# Patient Record
Sex: Female | Born: 2000 | Race: White | Hispanic: No | Marital: Married | Smoking: Never smoker
Health system: Southern US, Community
[De-identification: ages and names within clinical notes are randomized; demographics above are authoritative.]

## PROBLEM LIST (undated history)

## (undated) DIAGNOSIS — M79606 Pain in leg, unspecified: Secondary | ICD-10-CM

## (undated) DIAGNOSIS — J029 Acute pharyngitis, unspecified: Secondary | ICD-10-CM

## (undated) DIAGNOSIS — T7840XA Allergy, unspecified, initial encounter: Secondary | ICD-10-CM

## (undated) DIAGNOSIS — R002 Palpitations: Secondary | ICD-10-CM

## (undated) DIAGNOSIS — R0789 Other chest pain: Secondary | ICD-10-CM

## (undated) DIAGNOSIS — J988 Other specified respiratory disorders: Secondary | ICD-10-CM

## (undated) DIAGNOSIS — R000 Tachycardia, unspecified: Secondary | ICD-10-CM

## (undated) DIAGNOSIS — R509 Fever, unspecified: Secondary | ICD-10-CM

## (undated) DIAGNOSIS — R519 Headache, unspecified: Secondary | ICD-10-CM

## (undated) DIAGNOSIS — R569 Unspecified convulsions: Secondary | ICD-10-CM

## (undated) DIAGNOSIS — R5600 Simple febrile convulsions: Secondary | ICD-10-CM

## (undated) DIAGNOSIS — U071 COVID-19: Secondary | ICD-10-CM

## (undated) HISTORY — DX: Palpitations: R00.2

## (undated) HISTORY — DX: Simple febrile convulsions: R56.00

## (undated) HISTORY — DX: Headache, unspecified: R51.9

## (undated) HISTORY — DX: COVID-19: U07.1

## (undated) HISTORY — DX: Tachycardia, unspecified: R00.0

## (undated) HISTORY — DX: Other chest pain: R07.89

## (undated) HISTORY — DX: Pain in leg, unspecified: M79.606

## (undated) HISTORY — DX: Fever, unspecified: R50.9

## (undated) HISTORY — DX: Allergy, unspecified, initial encounter: T78.40XA

## (undated) HISTORY — DX: Acute pharyngitis, unspecified: J02.9

## (undated) HISTORY — DX: Other specified respiratory disorders: J98.8

## (undated) HISTORY — DX: Unspecified convulsions: R56.9

---
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
## 2019-12-30 ENCOUNTER — Emergency Department (HOSPITAL_COMMUNITY): Payer: BLUE CROSS/BLUE SHIELD

## 2019-12-30 ENCOUNTER — Other Ambulatory Visit: Payer: Self-pay

## 2019-12-30 ENCOUNTER — Emergency Department (HOSPITAL_COMMUNITY)
Admission: EM | Admit: 2019-12-30 | Discharge: 2019-12-31 | Disposition: A | Discharge status: Home / Self Care | Payer: BLUE CROSS/BLUE SHIELD | Attending: Emergency Medicine | Admitting: Emergency Medicine

## 2019-12-30 DIAGNOSIS — R509 Fever, unspecified: Secondary | ICD-10-CM | POA: Diagnosis present

## 2019-12-30 DIAGNOSIS — Z20822 Contact with and (suspected) exposure to covid-19: Secondary | ICD-10-CM | POA: Diagnosis not present

## 2019-12-30 DIAGNOSIS — R000 Tachycardia, unspecified: Secondary | ICD-10-CM | POA: Diagnosis not present

## 2019-12-30 DIAGNOSIS — R0789 Other chest pain: Secondary | ICD-10-CM | POA: Diagnosis not present

## 2019-12-30 DIAGNOSIS — R112 Nausea with vomiting, unspecified: Secondary | ICD-10-CM | POA: Insufficient documentation

## 2019-12-30 DIAGNOSIS — R519 Headache, unspecified: Secondary | ICD-10-CM | POA: Diagnosis not present

## 2019-12-30 DIAGNOSIS — J029 Acute pharyngitis, unspecified: Secondary | ICD-10-CM

## 2019-12-30 LAB — COMPREHENSIVE METABOLIC PANEL
ALT: 25 U/L (ref 0–44)
AST: 23 U/L (ref 15–41)
Albumin: 3.6 g/dL (ref 3.5–5.0)
Alkaline Phosphatase: 59 U/L (ref 38–126)
Anion gap: 13 (ref 5–15)
BUN: 5 mg/dL — ABNORMAL LOW (ref 6–20)
CO2: 22 mmol/L (ref 22–32)
Calcium: 8.9 mg/dL (ref 8.9–10.3)
Chloride: 97 mmol/L — ABNORMAL LOW (ref 98–111)
Creatinine, Ser: 0.84 mg/dL (ref 0.44–1.00)
GFR, Estimated: >60 mL/min (ref >60)
Glucose, Bld: 113 mg/dL — ABNORMAL HIGH (ref 70–99)
Potassium: 3.6 mmol/L (ref 3.5–5.1)
Sodium: 132 mmol/L — ABNORMAL LOW (ref 135–145)
Total Bilirubin: 0.6 mg/dL (ref 0.3–1.2)
Total Protein: 7.6 g/dL (ref 6.5–8.1)

## 2019-12-30 LAB — URINALYSIS, ROUTINE W REFLEX MICROSCOPIC
Bilirubin Urine: NEGATIVE
Glucose, UA: NEGATIVE mg/dL
Hgb urine dipstick: NEGATIVE
Ketones, ur: 20 mg/dL — AB
Nitrite: NEGATIVE
Protein, ur: NEGATIVE mg/dL
Specific Gravity, Urine: 1.01 (ref 1.005–1.030)
pH: 6 (ref 5.0–8.0)

## 2019-12-30 LAB — CBC WITH DIFFERENTIAL/PLATELET
Abs Immature Granulocytes: 0.05 10*3/uL (ref 0.00–0.07)
Basophils Absolute: 0 10*3/uL (ref 0.0–0.1)
Basophils Relative: 0 %
Eosinophils Absolute: 0 10*3/uL (ref 0.0–0.5)
Eosinophils Relative: 0 %
HCT: 36.3 % (ref 36.0–46.0)
Hemoglobin: 12.5 g/dL (ref 12.0–15.0)
Immature Granulocytes: 0 %
Lymphocytes Relative: 14 %
Lymphs Abs: 1.6 10*3/uL (ref 0.7–4.0)
MCH: 29.6 pg (ref 26.0–34.0)
MCHC: 34.4 g/dL (ref 30.0–36.0)
MCV: 86 fL (ref 80.0–100.0)
Monocytes Absolute: 0.9 10*3/uL (ref 0.1–1.0)
Monocytes Relative: 8 %
Neutro Abs: 9.1 10*3/uL — ABNORMAL HIGH (ref 1.7–7.7)
Neutrophils Relative %: 78 %
Platelets: 301 10*3/uL (ref 150–400)
RBC: 4.22 MIL/uL (ref 3.87–5.11)
RDW: 12.7 % (ref 11.5–15.5)
WBC: 11.7 10*3/uL — ABNORMAL HIGH (ref 4.0–10.5)
nRBC: 0 % (ref 0.0–0.2)

## 2019-12-30 LAB — I-STAT BETA HCG BLOOD, ED (MC, WL, AP ONLY): I-stat hCG, quantitative: 8 m[IU]/mL — ABNORMAL HIGH (ref <5)

## 2019-12-30 LAB — LACTIC ACID, PLASMA: Lactic Acid, Venous: 1.1 mmol/L (ref 0.5–1.9)

## 2019-12-30 MED ORDER — ACETAMINOPHEN 500 MG PO TABS
1000.0000 mg | ORAL_TABLET | Freq: Once | ORAL | Status: AC
  Administered 2019-12-30: 1000 mg via ORAL
  Filled 2019-12-30: qty 2

## 2019-12-30 NOTE — ED Triage Notes (Signed)
Pt has been sick intermittently since mid-October. Has been well through some of November but this past Tuesday woke up with high fever, has n/v, profoundly swollen lymph nodes. Just went to her PCP and had negative strep test but they told her that there throat looked terrible. Seen at multiple hospitals and doctor's offices in [place] for same with negative workups and negative tests for strep, mono, flu, RSV, covid, etc.

## 2019-12-31 ENCOUNTER — Emergency Department (HOSPITAL_COMMUNITY): Payer: BLUE CROSS/BLUE SHIELD

## 2019-12-31 LAB — HCG, SERUM, QUALITATIVE: Preg, Serum: NEGATIVE

## 2019-12-31 LAB — HIV ANTIBODY (ROUTINE TESTING W REFLEX): HIV Screen 4th Generation wRfx: NONREACTIVE

## 2019-12-31 LAB — RESP PANEL BY RT-PCR (FLU A&B, COVID) ARPGX2
Influenza A by PCR: NEGATIVE
Influenza B by PCR: NEGATIVE
SARS Coronavirus 2 by RT PCR: NEGATIVE

## 2019-12-31 LAB — D-DIMER, QUANTITATIVE: D-Dimer, Quant: 1.56 ug/mL-FEU — ABNORMAL HIGH (ref 0.00–0.50)

## 2019-12-31 LAB — TROPONIN I (HIGH SENSITIVITY): Troponin I (High Sensitivity): 4 ng/L (ref <18)

## 2019-12-31 LAB — RAPID HIV SCREEN (HIV 1/2 AB+AG)
HIV 1/2 Antibodies: NONREACTIVE
HIV-1 P24 Antigen - HIV24: NONREACTIVE

## 2019-12-31 LAB — GROUP A STREP BY PCR: Group A Strep by PCR: NOT DETECTED

## 2019-12-31 LAB — LACTIC ACID, PLASMA: Lactic Acid, Venous: 0.9 mmol/L (ref 0.5–1.9)

## 2019-12-31 LAB — MONONUCLEOSIS SCREEN: Mono Screen: NEGATIVE

## 2019-12-31 MED ORDER — DEXTROSE 5 % IV SOLN
500.0000 mg | Freq: Once | INTRAVENOUS | Status: AC
  Administered 2019-12-31: 500 mg via INTRAVENOUS
  Filled 2019-12-31: qty 500

## 2019-12-31 MED ORDER — DEXAMETHASONE 6 MG PO TABS: 6.0000 mg | ORAL_TABLET | Freq: Once | ORAL | 0 refills | Status: AC

## 2019-12-31 MED ORDER — IOHEXOL 350 MG/ML SOLN
80.0000 mL | Freq: Once | INTRAVENOUS | Status: AC | PRN
  Administered 2019-12-31: 80 mL via INTRAVENOUS

## 2019-12-31 MED ORDER — DEXAMETHASONE 6 MG PO TABS: 6.0000 mg | ORAL_TABLET | Freq: Once | ORAL | 0 refills | Status: DC

## 2019-12-31 MED ORDER — DOXYCYCLINE HYCLATE 100 MG PO CAPS: 100.0000 mg | ORAL_CAPSULE | Freq: Two times a day (BID) | ORAL | 0 refills | Status: DC

## 2019-12-31 MED ORDER — DOXYCYCLINE HYCLATE 100 MG PO CAPS: 100.0000 mg | ORAL_CAPSULE | Freq: Two times a day (BID) | ORAL | 0 refills | Status: AC

## 2019-12-31 NOTE — ED Provider Notes (Signed)
[facility] EMERGENCY DEPARTMENT Provider Note   CSN: [id number] Arrival date & time: 12/30/19  1545     History No chief complaint on file.   Alexis Norton is a 19 y.o. female.  Presents to ER with concern for fever.  Patient reports having similar illness in October.  This current illness started on Sunday.  Noted malaise, nausea and occasional episodes of vomiting but no abdominal pain.  Then on Tuesday she noted a fever and has had a fever daily since.  Yesterday started having a severe sore throat, worse with swallowing.  No neck pain or neck stiffness.  Has mild headache.  This only started a couple days ago.  Yesterday had an episode of palpitations, chest tightness, occurring at rest not associated with exertion, no alleviating or aggravating factors.  Resolved spontaneously while in waiting room of the ER.  No cough.  Primary doctor noted exudates on Friday and provided Rx for clindamycin.  She is sexually active, one female partner, has had oral sex, no known STD exposures.  Denies prior history of STDs. No new vaginal discharge, no pelvic pain or cramping.  She reports in late October she had fevers for many days, general feeling of unwell, went to urgent care and ER in [place].  Reports that they did lots of testing including mono, Covid, influenza which were negative.  She reports her WBCs were elevated at 24,000.  States that she was provided steroids and antibiotics and her symptoms resolved soon thereafter.  HPI     No past medical history on file.  There are no problems to display for this patient.  OB History   No obstetric history on file.     No family history on file.  Social History   Tobacco Use  . Smoking status: Not on file  Substance Use Topics  . Alcohol use: Not on file  . Drug use: Not on file    Home Medications Prior to Admission medications   Medication Sig Start Date End Date Taking? Authorizing Provider  ascorbic  acid (VITAMIN C) 500 MG tablet Take 500 mg by mouth daily.   Yes [provider]  ELDERBERRY PO Take 1 tablet by mouth daily.   Yes [provider]  loratadine (CLARITIN) 10 MG tablet Take 10 mg by mouth at bedtime.   Yes [provider]  Magnesium 250 MG TABS Take 2 tablets by mouth daily.   Yes [provider]  norgestrel-ethinyl estradiol (LO/OVRAL) 0.3-30 MG-MCG tablet Take 1 tablet by mouth daily.   Yes [provider]  Probiotic Product (PROBIOTIC PO) Take 1 capsule by mouth daily.   Yes [provider]  Vitamin D, Cholecalciferol, 25 MCG (1000 UT) TABS Take 1 tablet by mouth daily.   Yes [provider]    Allergies    Cephalosporins, Penicillin g, Sulfa antibiotics, and Sulfamethoxazole-trimethoprim  Review of Systems   Review of Systems  Constitutional: Positive for chills and fever.  HENT: Positive for sore throat. Negative for ear pain.   Eyes: Negative for pain and visual disturbance.  Respiratory: Positive for chest tightness and shortness of breath. Negative for cough.   Cardiovascular: Negative for chest pain and palpitations.  Gastrointestinal: Negative for abdominal pain and vomiting.  Genitourinary: Negative for dysuria and hematuria.  Musculoskeletal: Negative for arthralgias and back pain.  Skin: Negative for color change and rash.  Neurological: Negative for seizures and syncope.  All other systems reviewed and are negative.  Physical Exam Updated Vital Signs BP (!) 139/96   Pulse (!) 105   Temp 98.6 F (37 C) (Oral)   Resp 16   Ht 5' 8"  (1.727 m)   Wt 67.1 kg   LMP 12/16/2019 (Approximate)   SpO2 100%   BMI 22.50 kg/m   Physical Exam Vitals and nursing note reviewed.  Constitutional:      General: She is not in acute distress.    Appearance: She is well-developed.  HENT:     Head: Normocephalic and atraumatic.     Mouth/Throat:     Comments: Bilateral tonsillar exudates, no apparent  peritonsillar abscess Eyes:     Conjunctiva/sclera: Conjunctivae normal.  Neck:     Comments: Normal neck range of motion, noted cervical lymphadenopathy bilaterally Cardiovascular:     Rate and Rhythm: Regular rhythm. Tachycardia present.     Heart sounds: No murmur heard.   Pulmonary:     Effort: Pulmonary effort is normal. No respiratory distress.     Breath sounds: Normal breath sounds.  Abdominal:     Palpations: Abdomen is soft.     Tenderness: There is no abdominal tenderness.  Musculoskeletal:     Cervical back: Neck supple.  Skin:    General: Skin is warm and dry.  Neurological:     Mental Status: She is alert.     ED Results / Procedures / Treatments   Labs (all labs ordered are listed, but only abnormal results are displayed) Labs Reviewed  COMPREHENSIVE METABOLIC PANEL - Abnormal; Notable for the following components:      Result Value   Sodium 132 (*)    Chloride 97 (*)    Glucose, Bld 113 (*)    BUN 5 (*)    All other components within normal limits  CBC WITH DIFFERENTIAL/PLATELET - Abnormal; Notable for the following components:   WBC 11.7 (*)    Neutro Abs 9.1 (*)    All other components within normal limits  URINALYSIS, ROUTINE W REFLEX MICROSCOPIC - Abnormal; Notable for the following components:   APPearance HAZY (*)    Ketones, ur 20 (*)    Leukocytes,Ua LARGE (*)    Bacteria, UA FEW (*)    All other components within normal limits  I-STAT BETA HCG BLOOD, ED ([facility], [facility], [facility] ONLY) - Abnormal; Notable for the following components:   I-stat hCG, quantitative 8.0 (*)    All other components within normal limits  RESP PANEL BY RT-PCR (FLU A&B, COVID) ARPGX2  GROUP A STREP BY PCR  AEROBIC CULTURE (SUPERFICIAL SPECIMEN)  LACTIC ACID, PLASMA  LACTIC ACID, PLASMA  MONONUCLEOSIS SCREEN  HCG, SERUM, QUALITATIVE  D-DIMER, QUANTITATIVE (NOT AT [facility])  HIV ANTIBODY (ROUTINE TESTING W REFLEX)  RAPID HIV SCREEN (HIV 1/2 AB+AG)  RPR  GC/CHLAMYDIA PROBE AMP  ([place]) NOT AT [facility]  GC/CHLAMYDIA PROBE AMP ([place]) NOT AT [facility]  TROPONIN I (HIGH SENSITIVITY)    EKG None  Radiology DG Chest 2 View  Result Date: 12/30/2019 CLINICAL DATA:  Fever, chest tightness since Tuesday EXAM: CHEST - 2 VIEW COMPARISON:  None. FINDINGS: Few clustered punctate micro nodules are seen in the right lung. No focal consolidative opacity is seen. No pneumothorax or effusion. The cardiomediastinal contours are unremarkable. No acute osseous or soft tissue abnormality. IMPRESSION: Indeterminate clustered punctate micronodules in the right lung, could reflect atypical infection or sequela prior granulomatous disease. No other acute cardiopulmonary abnormality. Electronically Signed   By: Lovena Le M.D.   On:  12/30/2019 17:03    Procedures Procedures (including critical care time)  Medications Ordered in ED Medications  cefTRIAXone (ROCEPHIN) 500 mg in dextrose 5 % 50 mL IVPB (has no administration in time range)  acetaminophen (TYLENOL) tablet 1,000 mg (1,000 mg Oral Given 12/30/19 1617)    ED Course  I have reviewed the triage vital signs and the nursing notes.  Pertinent labs & imaging results that were available during my care of the patient were reviewed by me and considered in my medical decision making (see chart for details).    MDM Rules/Calculators/A&P                         19 year old girl presents to ER with myriad symptoms.  Current illness since Sunday, fever since Tuesday.  On exam noted significant tonsillar exudates but no apparent abscess.  Normal neck range of motion and patient is overall well-appearing.  Reports penicillin allergy, already prescribed clindamycin by primary doctor for suspected pharyngitis.  Strep negative. Given reported sexual history, will check for GC chlamydia, syphilis, HIV.  Will treat empirically for gonorrhea and chlamydia as gonococcal pharyngitis is on differential.  Given she reported similar febrile illness  a month ago, provided information for outpatient infectious disease clinic.  Initially febrile and tachycardic after period of observation, antipyretic, patient had normal heart rate and temperature.  Tolerating p.o. without any difficulty.  Will discharge home.  Additionally recommended close recheck with primary doctor, patient reports already having appointment early this coming week.  After the discussed management above, the patient was determined to be safe for discharge.  The patient was in agreement with this plan and all questions regarding their care were answered.  ED return precautions were discussed and the patient will return to the ED with any significant worsening of condition.   Final Clinical Impression(s) / ED Diagnoses Final diagnoses:  Acute pharyngitis, unspecified etiology  Fever in adult    Rx / DC Orders ED Discharge Orders    None       Lucrezia Starch, MD 12/31/19 1101

## 2019-12-31 NOTE — Discharge Instructions (Signed)
Take antibiotics as prescribed. Continue your clindamycin and start the doxycycline. Follow-up with both your primary doctor as well as infectious disease. If you have worsening fevers, worsening throat pain, inability to swallow, neck stiffness, or other new concerning symptom, return to ER for reassessment.

## 2020-01-01 LAB — RPR: RPR Ser Ql: NONREACTIVE

## 2020-01-02 LAB — GC/CHLAMYDIA PROBE AMP (~~LOC~~) NOT AT ARMC
Chlamydia: NEGATIVE
Chlamydia: NEGATIVE
Comment: NEGATIVE
Comment: NEGATIVE
Comment: NORMAL
Comment: NORMAL
Neisseria Gonorrhea: NEGATIVE
Neisseria Gonorrhea: NEGATIVE

## 2020-01-02 LAB — AEROBIC CULTURE W GRAM STAIN (SUPERFICIAL SPECIMEN): Culture: NORMAL

## 2020-01-03 ENCOUNTER — Telehealth: Payer: Self-pay | Admitting: *Deleted

## 2020-01-03 NOTE — Telephone Encounter (Signed)
Post ED Visit - Positive Culture Follow-up  Culture report reviewed by antimicrobial stewardship pharmacist: [facility] []  , Pharm.D. []  Enzo Bi, Pharm.D., BCPS AQ-ID []  , Pharm.D., BCPS [x]  Celedonio Miyamoto, Pharm.D., BCPS []  [place], Garvin Fila.D., BCPS, AAHIVP []  , Pharm.D., BCPS, AAHIVP []  Georgina Pillion, PharmD, BCPS []  , PharmD, BCPS []  [place], PharmD, BCPS []  [address], PharmD []  , PharmD, BCPS []  Estella Husk, PharmD  Pharmacy Team []  Lysle Pearl, PharmD []  , PharmD []  Phillips Climes, PharmD []  , Rph []  Agapito Games) , PharmD []  Verlan Friends, PharmD []  , PharmD []  Mervyn Gay, PharmD []  , PharmD []  Vinnie Level, PharmD []  [facility], PharmD []  , PharmD []  Len Childs, PharmD   Positive wound culture Treated with Doxycycline Hyclate, organism sensitive to the same and no further patient follow-up is required at this time.  [facility] 01/03/2020, 11:27 AM

## 2020-02-03 ENCOUNTER — Other Ambulatory Visit: Payer: Self-pay

## 2020-02-03 ENCOUNTER — Ambulatory Visit: Payer: BC Managed Care – PPO | Admitting: Infectious Diseases

## 2020-02-03 VITALS — BP 131/84 | HR 91 | Temp 97.9°F | Wt 156.8 lb

## 2020-02-03 DIAGNOSIS — Z114 Encounter for screening for human immunodeficiency virus [HIV]: Secondary | ICD-10-CM | POA: Diagnosis not present

## 2020-02-03 DIAGNOSIS — R000 Tachycardia, unspecified: Secondary | ICD-10-CM

## 2020-02-03 DIAGNOSIS — R509 Fever, unspecified: Secondary | ICD-10-CM | POA: Diagnosis not present

## 2020-02-03 NOTE — Progress Notes (Signed)
[facility]                                                             [address], [place], [place], [address]                                                                  Phn. [phone]; Fax: [phone]                                                                             Date: 02/03/2020  Reason for Referral: Fever of Unknown Origin   Assessment 20 Y O Healthy Female with no PMH other than mild COVID in January 2021 who is here for evaluation of recurrent episodes of fever. She has had 3 ,episodes of fever since mid October with a doctor visit and 3 ED visits with relatively unremarkable work up. Her most recent ED visit at [facility] was found to have severe pharyngitis with pharyngeal exudates for which she was given IV ceftriaxone  Work up so far CBC with mild leukocytosis 11. Otherwise CBC and CMP unremarkable. Group A strep PCR of throat negative. Throat Cx with normal OPF UA large leukocytes , few bacteria, WBC 11-20. Denies any GU symptoms Respiratory panel negative, Influenza A and B negative, Mono negative, SARSCoV2 negative HIV screen 4th generation negative RPR NR Oral and vaginal GC negative CT chest unremarkable She is not immunocompromised and has does not have any significant exposure to atypica/fungal infections  Her last febrile episode was more than a month ago and she feels almost 95% back to her baseline. The only thing that is bothering her is her Heart rate that goes up without any triggers. No murmur on exam. I think she will benefit from evaluation from Cardiology.  Her clinical presentation looks more of a viral process to me compared to bacterial/atypical infections   Plan No acute indication of any antibiotics currently Referral to Cardiology Will do the following for completeness  Orders Placed This Encounter  Procedures   Blood culture (routine  single)   Blood culture (routine single)   QuantiFERON-TB Gold Plus   Epstein-Barr virus nuclear antigen antibody, IgG   Epstein-Barr virus VCA antibody panel   CMV IgM   C-reactive protein   Sedimentation rate   CK (Creatine Kinase)   Lactate Dehydrogenase (LDH)   Hepatitis C antibody   Rheumatoid Factor   Antinuclear Antib (ANA)   HIV 1 RNA quant-no reflex-bld   TSH   Cytomegalovirus antibody, IgG   Cytomegalovirus antibody, IgG   Ambulatory referral to Cardiology   Gave instructions to her regarding immediate need for evaluation in case of worsening symptoms  Fu in 3 weeks   All questions and concerns were discussed and  addressed. Patient verbalized understanding of the plan. ____________________________________________________________________________________________________________________  HPI: 61 Y O female who is here for evaluation of fever with her mother. History is obtained from chart review and from patient.  She says at the end of September 2021, she got covid vaccine, 2nd dose of P fizer after which she had fever, body ache that went away in a day or two. Around third week of October, she noticed that her body is extremely hot. Her temperature was 100.7 F. Shecalled out of work. This was associated with headache. Fever was as high as  102.5 -103 which would not break with tylenol/ibuprofen. She went to a doctor in [place] where she was tested for Respiratory viruses, RSV, Mono etc all of which per patient was negative. She says she was sent home on a course of erythromicin and steroids which she took as instructed. She felt better for 2-3 days.  She again woke up few days later not  feeling well and started having fevers. She also felt like she almost passed out. She also had huge lymph nodes in the neck. Her mother says the lymph nodes were so much swollen that it extended up to her eyes. She went to Urgent care where she had a tempr of 103F, HR 165,  labs WBC 24.3K. She was sent to ED given her leukocytosis- given fluids which didn't help at all. She continued to be febrile and tachycardic. She was presumed to have left arm cellulitis and was given IV abx and was discharged clindamycin 7 days which she took as instructed. She does not think however she had cellulitis as she mostly lies in her left side and thinks that is why her left arm was red at that time  She felt better a day or two after which she  almost passed out at night and was rushed to  ED in [place] where her  WBC 11K within a day. The doctor told her that she is less likely to have a bacterial infection given sudden drop in WBC from 23K to 11K in a short span of time  and was told to follow up with her family doctor.  One month later, around the end of November, she again started having fever and lymph node swelling in her neck. She had poor appetite and nausea, NBNB vomiting and few episodes of loose stool. He was extremely fatigued. She presented to the ED on 12/4 at [facility], she was found to have severe pharyngitis with bilateral tonsillar exudates. She as given IV ceftriaoxne and sent home.  She also had h/o COVID in January 2021. She had low grade fever, loss of appetite and loss of tastes at that time. She did not need to be admitted in the hospital and did not received any COVID specific tx.   She says besides the febrile episodes, another issue bothering is her HR ranging in 130s-140s. She says she had several episodes where her HR was found to be as high as 160s even when she is resting and not exerting. She wears a smart watch which notifies her whenever her HR is high. She also remembers few instances where she was in the bed and her HR was in 130s. During  Christmas when she was arranging gifts, all of sudden her HR went up. Her HR has been also checked by a coworker with a pulse ox and it was high to make sure the smart watch is working properly. She works in radiology at  [employer]  She also has bilateral legs pain shooting down. Also had nausea/vomiting and diarrhea with febrile episodes. Has lost 10 lbs during these febrile episodes She also had a rash in her face which has resolved now.   Mother says she has been tested: 10 covid test, 5 flu swabs, 4 strep tests, 3 mono tests, tick panel - all were negative Denies cough and SOB but complains of chest tightness. Denies any outdooor activities or tick exposure  She also has chills and shaking when fever resolves associated with intense sweating   PMH- no  PSH- wisdom teeth removed Meds- OCP, claritin Allergy - Penicillin,  Social - no smoking, alcohol and drugs Allergy: Bactrim , cephalosporins,  Social: Sexually active with oyfriend. Last sexual activity  was Wednesday . He is not sick. No sick contacts. No GU symptoms. LMP today. Denies any vaginal discharge/itching and burning   Born in [place], [place]. [place] No recent travel except to [place]  In [place] in Summer 2021. Has traveled to [facility] [place], [place] at least 4-5 years ago and traveled to  [place] April last year  No travel out of country except [place] 4 years ago  Has a pet dog and outside. Both are not sick  Appetite is ok now, weight is coming back to normal   Headache with fevers, denies blurry vision, denies ear pain and sinus pain  Last febrile episode in Dec 4 She feels 95% back to basline by now  ROS: 11 point ROS done with pertinent positives and negatives listed above, Rest of ROS is negative    Current Outpatient Medications on File Prior to Visit  Medication Sig Dispense Refill   ascorbic acid (VITAMIN C) 500 MG tablet Take 500 mg by mouth daily.     ELDERBERRY PO Take 1 tablet by mouth daily.     loratadine (CLARITIN) 10 MG tablet Take 10 mg by mouth at bedtime.     Magnesium 250 MG TABS Take 2 tablets by mouth daily.     norgestrel-ethinyl estradiol (LO/OVRAL) 0.3-30 MG-MCG tablet Take 1 tablet by mouth daily.      Probiotic Product (PROBIOTIC PO) Take 1 capsule by mouth daily.     Vitamin D, Cholecalciferol, 25 MCG (1000 UT) TABS Take 1 tablet by mouth daily.     ZINC CITRATE PO Take by mouth.     No current facility-administered medications on file prior to visit.   Allergies  Allergen Reactions   Cephalosporins Hives and Itching   Penicillin G Hives   Sulfa Antibiotics Hives and Itching   Sulfamethoxazole-Trimethoprim Hives and Itching   Social History   Socioeconomic History   Marital status: Single    Spouse name: Not on file   Number of children: Not on file   Years of education: Not on file   Highest education level: Not on file  Occupational History   Not on file  Tobacco Use   Smoking status: Not on file   Smokeless tobacco: Not on file  Substance and Sexual Activity   Alcohol use: Not on file   Drug use: Not on file   Sexual activity: Not on file  Other Topics Concern   Not on file  Social History Narrative   Not on file   Social Determinants of Health   Financial Resource Strain: Not on file  Food Insecurity: Not on file  Transportation Needs: Not on file  Physical Activity: Not on file  Stress: Not on file  Social Connections: Not on  file  Intimate Partner Violence: Not on file     Vitals BP 131/84    Pulse 91    Temp 97.9 F (36.6 C) (Oral)    Wt 156 lb 12.8 oz (71.1 kg)    BMI 23.84 kg/m    Examination  General - not in acute distress, comfortably sitting in chair HEENT - PEERLA, no pallor and no icterus Chest - b/l clear air entry, no additional sounds CVS- Normal s1s2, RRR Abdomen - Soft, Non tender , non distended Ext- no pedal edema Neuro: grossly normal Back - WNL Lymph nodes - no cervical and axillary lymphadenopathy  Skin - no rashes  Psych : calm and cooperative   Recent labs CBC Latest Ref Rng & Units 12/30/2019  WBC 4.0 - 10.5 K/uL 11.7(H)  Hemoglobin 12.0 - 15.0 g/dL 12.5  Hematocrit 36.0 - 46.0 % 36.3  Platelets  150 - 400 K/uL 301   CMP Latest Ref Rng & Units 12/30/2019  Glucose 70 - 99 mg/dL 113(H)  BUN 6 - 20 mg/dL 5(L)  Creatinine 0.44 - 1.00 mg/dL 0.84  Sodium 135 - 145 mmol/L 132(L)  Potassium 3.5 - 5.1 mmol/L 3.6  Chloride 98 - 111 mmol/L 97(L)  CO2 22 - 32 mmol/L 22  Calcium 8.9 - 10.3 mg/dL 8.9  Total Protein 6.5 - 8.1 g/dL 7.6  Total Bilirubin 0.3 - 1.2 mg/dL 0.6  Alkaline Phos 38 - 126 U/L 59  AST 15 - 41 U/L 23  ALT 0 - 44 U/L 25     Pertinent Microbiology Results for orders placed or performed during the hospital encounter of 12/30/19  Resp Panel by RT-PCR (Flu A&B, Covid) Nasopharyngeal Swab     Status: None   Collection Time: 12/31/19  8:55 AM   Specimen: Nasopharyngeal Swab; Nasopharyngeal(NP) swabs in vial transport medium  Result Value Ref Range Status   SARS Coronavirus 2 by RT PCR NEGATIVE NEGATIVE Final    Comment: (NOTE) SARS-CoV-2 target nucleic acids are NOT DETECTED.  The SARS-CoV-2 RNA is generally detectable in upper respiratory specimens during the acute phase of infection. The lowest concentration of SARS-CoV-2 viral copies this assay can detect is 138 copies/mL. A negative result does not preclude SARS-Cov-2 infection and should not be used as the sole basis for treatment or other patient management decisions. A negative result may occur with  improper specimen collection/handling, submission of specimen other than nasopharyngeal swab, presence of viral mutation(s) within the areas targeted by this assay, and inadequate number of viral copies(<138 copies/mL). A negative result must be combined with clinical observations, patient history, and epidemiological information. The expected result is Negative.  Fact Sheet for Patients:  EntrepreneurPulse.com.au  Fact Sheet for Healthcare Providers:  IncredibleEmployment.be  This test is no t yet approved or cleared by the [place] FDA and  has been authorized for  detection and/or diagnosis of SARS-CoV-2 by FDA under an Emergency Use Authorization (EUA). This EUA will remain  in effect (meaning this test can be used) for the duration of the COVID-19 declaration under Section 564(b)(1) of the Act, 21 U.S.C.section 360bbb-3(b)(1), unless the authorization is terminated  or revoked sooner.       Influenza A by PCR NEGATIVE NEGATIVE Final   Influenza B by PCR NEGATIVE NEGATIVE Final    Comment: (NOTE) The Xpert Xpress SARS-CoV-2/FLU/RSV plus assay is intended as an aid in the diagnosis of influenza from Nasopharyngeal swab specimens and should not be used as a sole basis for treatment. Nasal washings  and aspirates are unacceptable for Xpert Xpress SARS-CoV-2/FLU/RSV testing.  Fact Sheet for Patients: BloggerCourse.com  Fact Sheet for Healthcare Providers: SeriousBroker.it  This test is not yet approved or cleared by the [place] FDA and has been authorized for detection and/or diagnosis of SARS-CoV-2 by FDA under an Emergency Use Authorization (EUA). This EUA will remain in effect (meaning this test can be used) for the duration of the COVID-19 declaration under Section 564(b)(1) of the Act, 21 U.S.C. section 360bbb-3(b)(1), unless the authorization is terminated or revoked.  Performed at [facility], [address], [place], [place] [address]   Group A Strep by PCR     Status: None   Collection Time: 12/31/19  8:55 AM   Specimen: Nasopharyngeal Swab; Sterile Swab  Result Value Ref Range Status   Group A Strep by PCR NOT DETECTED NOT DETECTED Final    Comment: Performed at [facility], [address], [place], [place] [address]  Wound or Superficial Culture     Status: None   Collection Time: 12/31/19  9:33 AM   Specimen: Wound  Result Value Ref Range Status   Specimen Description WOUND  Final   Special Requests THROAT  Final   Gram Stain   Final    RARE WBC  PRESENT, PREDOMINANTLY PMN ABUNDANT GRAM NEGATIVE RODS RARE GRAM POSITIVE COCCI    Culture   Final    FEW NORMAL OROPHARYNGEAL FLORA NO GROUP A STREP (S.PYOGENES) ISOLATED NO STAPHYLOCOCCUS AUREUS ISOLATED Performed at [facility], [address], [place], [place] [address]    Report Status 01/02/2020 FINAL  Final   Pertinent Imagings CT chest 12/31/19 FINDINGS: Cardiovascular: Thoracic aorta is well visualized. No significant aneurysmal dilatation of the thoracic aorta is noted. No cardiac enlargement is seen. No significant coronary calcifications are noted. The pulmonary artery shows a normal branching pattern. No filling defect to suggest pulmonary embolism is seen.  Mediastinum/Nodes: Thoracic inlet is within normal limits. No sizable hilar or mediastinal adenopathy is noted. Residual thymus tissue is noted anteriorly. The esophagus as visualized is within normal limits.  Lungs/Pleura: The lungs are well aerated bilaterally. No focal infiltrate or sizable effusion is seen.  Upper Abdomen: Visualized upper abdomen is unremarkable.  Musculoskeletal: No acute bony abnormality noted.  Review of the MIP images confirms the above findings.  IMPRESSION: No evidence of pulmonary emboli.  No other focal abnormality is noted.  All pertinent labs/Imagings/notes reviewed. All pertinent plain films and CT images have been personally visualized and interpreted; radiology reports have been reviewed. Decision making incorporated into the Impression / Recommendations.  I spent greater than 25 minutes with the patient including  review of prior medical records with greater than 50% of time in face to face counsel of the patient.    Electronically signed by:  Odette Fraction, MD Infectious Disease Physician [facility] [address] [place], [place] [address] Phone: [phone]   Fax: [phone]

## 2020-02-04 DIAGNOSIS — Z114 Encounter for screening for human immunodeficiency virus [HIV]: Secondary | ICD-10-CM | POA: Insufficient documentation

## 2020-02-04 DIAGNOSIS — R509 Fever, unspecified: Secondary | ICD-10-CM | POA: Insufficient documentation

## 2020-02-04 DIAGNOSIS — R000 Tachycardia, unspecified: Secondary | ICD-10-CM | POA: Insufficient documentation

## 2020-02-04 NOTE — Assessment & Plan Note (Signed)
I will check for HIV RNA for possible acute retroviral syndrome

## 2020-02-04 NOTE — Assessment & Plan Note (Signed)
Referral to Cardiology

## 2020-02-04 NOTE — Assessment & Plan Note (Signed)
Likely a viral process Will do further labs today No indication of antibiotics currently Fu in 3 weeks

## 2020-02-06 NOTE — Progress Notes (Signed)
Cardiology Office Note:    Date:  02/07/2020   ID:  Alexis Norton, DOB Aug 28, 2000, MRN [id number]  PCP:  Patient, No Pcp Per  [facility] Cardiologist:  No primary care provider on file.  [facility] Electrophysiologist:  None   Referring MD: Odette Fraction, MD    History of Present Illness:    Alexis Norton is a 20 y.o. female with a history of COVID in 01/2019 who was referred by Dr. Elinor Parkinson for further evaluation of sinus tachycardia.  Patient was seen in ID clinic for recurrent fevers. Large infectious and rheumatologic work-up performed with no clear source. Thought possibly due to viral illness. During that visit, the patient stated that she had episodes of tachycardia without clear triggers for which she was referred to cardiology for further management. ECG with NSR, early r-wave progression.   Today, the patient states that she got the second dose of her COVID vaccine in September. Had fever, body aches and HA x2 days afterwards. 3 weeks later she had high fever, lymphadenopathy, and  myalgias. Took ABX and steroids with some improvement but as soon as she stopped, she again developed fevers, lymphadenopathy and myalgias. She went to the hospital and got another round of ABX for ?cellulitis although did not think she truly had cellulitis. She was okay for approximately 1 month but again woke up with recurrent lymphadenopathy. She went to work and was notified by her watch that her HR was 120s. Sustained >100bpm all day. The next day, she again woke up with fever, myalgias and severe chills at which point she want to [facility] [facility]. She had a sore throat on the left side as well and she was diagnosed with severe pharyngitis. She was sent home on ABX. Since then (early December), she has been without fevers but has continued to have episodes of tachycardia. Can come on at anytime and she feels like her heart is racing out of her chest. Symptoms are becoming more frequent  despite improvement of fevers. Not triggered with exercise. She also has been having feelings of weakness like her blood glucose is low. The weakness resolves with eating sugar. Her symptoms of weakness do not always correlate with her tachycardia. She has been followed by Infectious disease but given the continued palpitations, she is referred here for further management.  Denies any shortness of breath, orthopnea, PND, LE edema. No LOC, nausea, vomiting. No smoking, excessive caffeine use, drug use.   Past Medical History:  Diagnosis Date  . Chest tightness   . COVID   . Fever   . Headache   . Leg pain   . Palpitations   . Pharyngitis   . Respiratory infection   . Sore throat   . Tachycardia     History reviewed. No pertinent surgical history.  Current Medications: Current Meds  Medication Sig  . ascorbic acid (VITAMIN C) 500 MG tablet Take 500 mg by mouth daily.  . B Complex Vitamins (B COMPLEX PO) Take by mouth daily.  Marland Kitchen ELDERBERRY PO Take 1 tablet by mouth daily.  Marland Kitchen HAWTHORN BERRY PO Take by mouth daily.  Marland Kitchen loratadine (CLARITIN) 10 MG tablet Take 10 mg by mouth at bedtime.  . Magnesium 250 MG TABS Take 2 tablets by mouth daily.  . norgestrel-ethinyl estradiol (LO/OVRAL) 0.3-30 MG-MCG tablet Take 1 tablet by mouth daily.  . Probiotic Product (PROBIOTIC PO) Take 1 capsule by mouth daily.  . Vitamin D, Cholecalciferol, 25 MCG (1000 UT) TABS Take 1 tablet  by mouth daily.  Marland Kitchen ZINC CITRATE PO Take by mouth.     Allergies:   Cephalosporins, Penicillin g, Sulfa antibiotics, and Sulfamethoxazole-trimethoprim   Social History   Socioeconomic History  . Marital status: Single    Spouse name: Not on file  . Number of children: Not on file  . Years of education: Not on file  . Highest education level: Not on file  Occupational History  . Not on file  Tobacco Use  . Smoking status: Never Smoker  . Smokeless tobacco: Never Used  Substance and Sexual Activity  . Alcohol use:  Not on file  . Drug use: Not on file  . Sexual activity: Not on file  Other Topics Concern  . Not on file  Social History Narrative  . Not on file   Social Determinants of Health   Financial Resource Strain: Not on file  Food Insecurity: Not on file  Transportation Needs: Not on file  Physical Activity: Not on file  Stress: Not on file  Social Connections: Not on file     Family History: The patient's family history is not on file.  ROS:   Please see the history of present illness.    Review of Systems  Constitutional: Positive for chills, fever and malaise/fatigue.  HENT: Negative for congestion.   Eyes: Negative for pain.  Respiratory: Negative for shortness of breath.   Cardiovascular: Positive for chest pain and palpitations. Negative for orthopnea, claudication, leg swelling and PND.  Gastrointestinal: Negative for blood in stool, nausea and vomiting.  Genitourinary: Negative for hematuria.  Musculoskeletal: Positive for myalgias.  Neurological: Positive for weakness. Negative for loss of consciousness.  Endo/Heme/Allergies: Negative for polydipsia.  Psychiatric/Behavioral: Negative for substance abuse.    EKGs/Labs/Other Studies Reviewed:    The following studies were reviewed today: CT PE Protocol 12/31/19: FINDINGS: Cardiovascular: Thoracic aorta is well visualized. No significant aneurysmal dilatation of the thoracic aorta is noted. No cardiac enlargement is seen. No significant coronary calcifications are noted. The pulmonary artery shows a normal branching pattern. No filling defect to suggest pulmonary embolism is seen.  Mediastinum/Nodes: Thoracic inlet is within normal limits. No sizable hilar or mediastinal adenopathy is noted. Residual thymus tissue is noted anteriorly. The esophagus as visualized is within normal limits.  Lungs/Pleura: The lungs are well aerated bilaterally. No focal infiltrate or sizable effusion is seen.  Upper Abdomen:  Visualized upper abdomen is unremarkable.  Musculoskeletal: No acute bony abnormality noted.  Review of the MIP images confirms the above findings.  IMPRESSION: No evidence of pulmonary emboli.  EKG:  EKG is  ordered today.  The ekg ordered today demonstrates NSR with HR 77, early r-wave progression  Recent Labs: 12/30/2019: ALT 25; BUN 5; Creatinine, Ser 0.84; Hemoglobin 12.5; Platelets 301; Potassium 3.6; Sodium 132 02/03/2020: TSH 1.34  Recent Lipid Panel No results found for: CHOL, TRIG, HDL, CHOLHDL, VLDL, LDLCALC, LDLDIRECT   Risk Assessment/Calculations:       Physical Exam:    VS:  BP 122/78   Pulse 70   Ht 5\' 8"  (1.727 m)   Wt 153 lb 3.2 oz (69.5 kg)   SpO2 97%   BMI 23.29 kg/m     Wt Readings from Last 3 Encounters:  02/07/20 153 lb 3.2 oz (69.5 kg)  02/03/20 156 lb 12.8 oz (71.1 kg)  12/30/19 148 lb (67.1 kg)     GEN:  Well nourished, well developed in no acute distress HEENT: Normal NECK: No JVD; No carotid bruits CARDIAC:  RRR, no murmurs, rubs, gallops RESPIRATORY:  Clear to auscultation without rales, wheezing or rhonchi  ABDOMEN: Soft, non-tender, non-distended MUSCULOSKELETAL:  No edema; No deformity  SKIN: Warm and dry NEUROLOGIC:  Alert and oriented x 3 PSYCHIATRIC:  Normal affect   ASSESSMENT:    1. Tachycardia   2. Palpitations   3. Fever of unknown origin    PLAN:    In order of problems listed above:  #Palpitations #Tachycardia: Patient with recurrent fevers, lympadenopathy, and myalgias since October as detailed above currently undergoing infectious and rheumatologic work-up. Since the onset of these episodes, she has noted sustained HR 120-160s without known triggers. Episodes of tachycardia can occur at rest or with activity and can sustain throughout the entire day. Has some associated chest tightness and the sensation of palpitations. No SOB, orthopnea, LOC, LE edema. Symptoms have worsened despite improvement in her fevers. Will  proceed with zio monitor and if abnormal, will pursue TTE.  -Place 2 week zio monitor -If abnormal or if rheumatologic work-up abnormal, will check TTE -Continue work-up of recurrent fevers per ID and possible rheum -Maintain adequate hydration and avoid excessive caffeine -Small snacks throughout the day to avoid hypoglycemia as patient has episodes of weakness that resolves with eating -Not anemic, TSH normal -CTA negative for PE -Follow-up in clinic in 2 months  #Recurrent fevers with lymphadenopathy: Patient with recurrent episodes of fever, myalgias and lymphadenopathy since October. Now improved over the past month. Follows with ID with work-up thus far unremarkable.  -Follow-up with ID as scheduled -Consider rheum referral    Medication Adjustments/Labs and Tests Ordered: Current medicines are reviewed at length with the patient today.  Concerns regarding medicines are outlined above.  Orders Placed This Encounter  Procedures  . LONG TERM MONITOR-LIVE TELEMETRY (3-14 DAYS)  . EKG 12-Lead   No orders of the defined types were placed in this encounter.   Patient Instructions  Medication Instructions:  Your physician recommends that you continue on your current medications as directed. Please refer to the Current Medication list given to you today.   *If you need a refill on your cardiac medications before your next appointment, please call your pharmacy*   Lab Work: None ordered  If you have labs (blood work) drawn today and your tests are completely normal, you will receive your results only by: Marland Kitchen. MyChart Message (if you have MyChart) OR . A paper copy in the mail If you have any lab test that is abnormal or we need to change your treatment, we will call you to review the results.   Testing/Procedures: Christena DeemZIO XT- Long Term Monitor Instructions   Your physician has requested you wear your ZIO patch monitor 14 days.   This is a single patch monitor.  [employer] supplies  one patch monitor per enrollment.  Additional stickers are not available.   Please do not apply patch if you will be having a Nuclear Stress Test, Echocardiogram, Cardiac CT, MRI, or Chest Xray during the time frame you would be wearing the monitor. The patch cannot be worn during these tests.  You cannot remove and re-apply the ZIO XT patch monitor.   Your ZIO patch monitor will be sent USPS Priority mail from [facility] [employer] directly to your home address. The monitor may also be mailed to a PO BOX if home delivery is not available.   It may take 3-5 days to receive your monitor after you have been enrolled.   Once you have received you monitor, please  review enclosed instructions.  Your monitor has already been registered assigning a specific monitor serial # to you.   Applying the monitor   Shave hair from upper left chest.   Hold abrader disc by orange tab.  Rub abrader in 40 strokes over left upper chest as indicated in your monitor instructions.   Clean area with 4 enclosed alcohol pads .  Use all pads to assure are is cleaned thoroughly.  Let dry.   Apply patch as indicated in monitor instructions.  Patch will be place under collarbone on left side of chest with arrow pointing upward.   Rub patch adhesive wings for 2 minutes.Remove white label marked "1".  Remove white label marked "2".  Rub patch adhesive wings for 2 additional minutes.   While looking in a mirror, press and release button in center of patch.  A small green light will flash 3-4 times .  This will be your only indicator the monitor has been turned on.     Do not shower for the first 24 hours.  You may shower after the first 24 hours.   Press button if you feel a symptom. You will hear a small click.  Record Date, Time and Symptom in the Patient Log Book.   When you are ready to remove patch, follow instructions on last 2 pages of Patient Log Book.  Stick patch monitor onto last page of Patient Log Book.    Place Patient Log Book in [place] box.  Use locking tab on box and tape box closed securely.  The [employer] and [employer] has [employer] on it.  Please place in mailbox as soon as possible.  Your physician should have your test results approximately 7 days after the monitor has been mailed back to [facility].   Call [facility] Customer Care at [phone] if you have questions regarding your ZIO XT patch monitor.  Call them immediately if you see an orange light blinking on your monitor.   If your monitor falls off in less than 4 days contact our Monitor department at [phone].  If your monitor becomes loose or falls off after 4 days call [employer] at [phone] for suggestions on securing your monitor.     Follow-Up: At [facility], you and your health needs are our priority.  As part of our continuing mission to provide you with exceptional heart care, we have created designated Provider Care Teams.  These Care Teams include your primary Cardiologist (physician) and Advanced Practice Providers (APPs -  Physician Assistants and Nurse Practitioners) who all work together to provide you with the care you need, when you need it.  We recommend signing up for the patient portal called "MyChart".  Sign up information is provided on this After Visit Summary.  MyChart is used to connect with patients for Virtual Visits (Telemedicine).  Patients are able to view lab/test results, encounter notes, upcoming appointments, etc.  Non-urgent messages can be sent to your provider as well.   To learn more about what you can do with MyChart, go to [email or web address].    Your next appointment:   2 month(s)  The format for your next appointment:   In Person  Provider:   Laurance Flatten, MD   Other Instructions      Signed, Meriam Sprague, MD  02/07/2020 2:46 PM    [facility]

## 2020-02-07 ENCOUNTER — Other Ambulatory Visit: Payer: Self-pay

## 2020-02-07 ENCOUNTER — Encounter: Payer: Self-pay | Admitting: *Deleted

## 2020-02-07 ENCOUNTER — Ambulatory Visit (INDEPENDENT_AMBULATORY_CARE_PROVIDER_SITE_OTHER): Payer: BC Managed Care – PPO

## 2020-02-07 ENCOUNTER — Ambulatory Visit (INDEPENDENT_AMBULATORY_CARE_PROVIDER_SITE_OTHER): Payer: BC Managed Care – PPO | Admitting: Cardiology

## 2020-02-07 VITALS — BP 122/78 | HR 70 | Ht 68.0 in | Wt 153.2 lb

## 2020-02-07 DIAGNOSIS — R002 Palpitations: Secondary | ICD-10-CM

## 2020-02-07 DIAGNOSIS — R000 Tachycardia, unspecified: Secondary | ICD-10-CM

## 2020-02-07 DIAGNOSIS — R509 Fever, unspecified: Secondary | ICD-10-CM | POA: Diagnosis not present

## 2020-02-07 NOTE — Addendum Note (Signed)
Addended by: Burnetta Sabin on: 02/07/2020 03:20 PM   Modules accepted: Orders

## 2020-02-07 NOTE — Progress Notes (Signed)
Patient ID: Alexis Norton, female   DOB: 2001-01-03, 19 y.o.   MRN: [id number] Patient enrolled for [employer] to ship a 14 day ZIO XT long term holter monitor to address on file.

## 2020-02-07 NOTE — Patient Instructions (Addendum)
Medication Instructions:  Your physician recommends that you continue on your current medications as directed. Please refer to the Current Medication list given to you today.   *If you need a refill on your cardiac medications before your next appointment, please call your pharmacy*   Lab Work: None ordered  If you have labs (blood work) drawn today and your tests are completely normal, you will receive your results only by: Marland Kitchen MyChart Message (if you have MyChart) OR . A paper copy in the mail If you have any lab test that is abnormal or we need to change your treatment, we will call you to review the results.   Testing/Procedures: Christena Deem- Long Term Monitor Instructions   Your physician has requested you wear your ZIO patch monitor 14 days.   This is a single patch monitor.  [employer] supplies one patch monitor per enrollment.  Additional stickers are not available.   Please do not apply patch if you will be having a Nuclear Stress Test, Echocardiogram, Cardiac CT, MRI, or Chest Xray during the time frame you would be wearing the monitor. The patch cannot be worn during these tests.  You cannot remove and re-apply the ZIO XT patch monitor.   Your ZIO patch monitor will be sent USPS Priority mail from [facility] directly to your home address. The monitor may also be mailed to a PO BOX if home delivery is not available.   It may take 3-5 days to receive your monitor after you have been enrolled.   Once you have received you monitor, please review enclosed instructions.  Your monitor has already been registered assigning a specific monitor serial # to you.   Applying the monitor   Shave hair from upper left chest.   Hold abrader disc by orange tab.  Rub abrader in 40 strokes over left upper chest as indicated in your monitor instructions.   Clean area with 4 enclosed alcohol pads .  Use all pads to assure are is cleaned thoroughly.  Let dry.   Apply patch as indicated in  monitor instructions.  Patch will be place under collarbone on left side of chest with arrow pointing upward.   Rub patch adhesive wings for 2 minutes.Remove white label marked "1".  Remove white label marked "2".  Rub patch adhesive wings for 2 additional minutes.   While looking in a mirror, press and release button in center of patch.  A small green light will flash 3-4 times .  This will be your only indicator the monitor has been turned on.     Do not shower for the first 24 hours.  You may shower after the first 24 hours.   Press button if you feel a symptom. You will hear a small click.  Record Date, Time and Symptom in the Patient Log Book.   When you are ready to remove patch, follow instructions on last 2 pages of Patient Log Book.  Stick patch monitor onto last page of Patient Log Book.   Place Patient Log Book in [place] box.  Use locking tab on box and tape box closed securely.  The [employer] has [employer] on it.  Please place in mailbox as soon as possible.  Your physician should have your test results approximately 7 days after the monitor has been mailed back to [facility].   Call [facility] Customer Care at [phone] if you have questions regarding your ZIO XT patch monitor.  Call them immediately  if you see an orange light blinking on your monitor.   If your monitor falls off in less than 4 days contact our Monitor department at [phone].  If your monitor becomes loose or falls off after 4 days call [employer] at [phone] for suggestions on securing your monitor.     Follow-Up: At [facility], you and your health needs are our priority.  As part of our continuing mission to provide you with exceptional heart care, we have created designated Provider Care Teams.  These Care Teams include your primary Cardiologist (physician) and Advanced Practice Providers (APPs -  Physician Assistants and Nurse Practitioners) who all work together to provide  you with the care you need, when you need it.  We recommend signing up for the patient portal called "MyChart".  Sign up information is provided on this After Visit Summary.  MyChart is used to connect with patients for Virtual Visits (Telemedicine).  Patients are able to view lab/test results, encounter notes, upcoming appointments, etc.  Non-urgent messages can be sent to your provider as well.   To learn more about what you can do with MyChart, go to [email or web address].    Your next appointment:   2 month(s)  The format for your next appointment:   In Person  Provider:   Laurance Flatten, MD   Other Instructions

## 2020-02-09 LAB — CULTURE, BLOOD (SINGLE): MICRO NUMBER:: 11398955

## 2020-02-10 ENCOUNTER — Telehealth: Payer: Self-pay

## 2020-02-10 LAB — TSH: TSH: 1.34 mIU/L

## 2020-02-10 LAB — QUANTIFERON-TB GOLD PLUS
Mitogen-NIL: >10 IU/mL
NIL: 0.05 IU/mL
QuantiFERON-TB Gold Plus: NEGATIVE
TB1-NIL: <0 IU/mL
TB2-NIL: <0 IU/mL

## 2020-02-10 LAB — CULTURE, BLOOD (SINGLE): MICRO NUMBER:: 11398954

## 2020-02-10 LAB — ANTI-NUCLEAR AB-TITER (ANA TITER): ANA Titer 1: 1:80 {titer} — ABNORMAL HIGH

## 2020-02-10 LAB — EPSTEIN-BARR VIRUS VCA ANTIBODY PANEL
EBV NA IgG: <18 U/mL
EBV VCA IgG: <18 U/mL
EBV VCA IgM: <36 U/mL

## 2020-02-10 LAB — HIV-1 RNA QUANT-NO REFLEX-BLD
HIV 1 RNA Quant: <20 Copies/mL
HIV-1 RNA Quant, Log: <1.3 Log cps/mL

## 2020-02-10 LAB — HEPATITIS C ANTIBODY
Hepatitis C Ab: NONREACTIVE
SIGNAL TO CUT-OFF: 0.01 (ref <1.00)

## 2020-02-10 LAB — LACTATE DEHYDROGENASE: LDH: 150 U/L (ref 100–200)

## 2020-02-10 LAB — ANA: Anti Nuclear Antibody (ANA): POSITIVE — AB

## 2020-02-10 LAB — SEDIMENTATION RATE: Sed Rate: 11 mm/h (ref 0–20)

## 2020-02-10 LAB — RHEUMATOID FACTOR: Rheumatoid fact SerPl-aCnc: <14 IU/mL (ref <14)

## 2020-02-10 LAB — C-REACTIVE PROTEIN: CRP: 3.2 mg/L (ref <8.0)

## 2020-02-10 LAB — CK: Total CK: 126 U/L (ref 29–143)

## 2020-02-10 LAB — CMV IGM: CMV IgM: <30 AU/mL

## 2020-02-10 LAB — CYTOMEGALOVIRUS ANTIBODY, IGG: Cytomegalovirus Ab-IgG: <0.6 U/mL

## 2020-02-10 NOTE — Telephone Encounter (Signed)
Attempted to call patient to relay provider's message. Unable to reach patient at this time. Left voicemail requesting patient call office back regarding lab results. Did not disclose results in message.

## 2020-02-10 NOTE — Telephone Encounter (Signed)
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 She would need to see a Rheumatology. If she prefers, I can order a referral.   Thanks.

## 2020-02-10 NOTE — Telephone Encounter (Signed)
Patient returned call and made aware of results. Patient agrees to Rheumatology referral.  Routing to MD to place orders.

## 2020-02-17 NOTE — Telephone Encounter (Signed)
Spoke with provider who will enter referral. Will forward message to referral coordinator to help schedule.

## 2020-02-19 ENCOUNTER — Other Ambulatory Visit: Payer: Self-pay | Admitting: Infectious Diseases

## 2020-02-19 DIAGNOSIS — R768 Other specified abnormal immunological findings in serum: Secondary | ICD-10-CM

## 2020-02-20 NOTE — Telephone Encounter (Signed)
I don't think so she needs to follow up with me tomorrow unless there are new concerns

## 2020-02-21 ENCOUNTER — Ambulatory Visit: Payer: BC Managed Care – PPO | Admitting: Infectious Diseases

## 2020-02-21 ENCOUNTER — Encounter: Payer: Self-pay | Admitting: *Deleted

## 2020-02-21 ENCOUNTER — Other Ambulatory Visit: Payer: Self-pay

## 2020-03-26 NOTE — Progress Notes (Signed)
Office Visit Note  Patient: Alexis Norton             Date of Birth: 2000/07/13           MRN: [id number]             PCP: Patient, No Pcp Per Referring: Rosiland Oz, MD Visit Date: 03/27/2020 Occupation: Xray technician  Subjective:  New Patient (Initial Visit) (Patient complains of several episodes of sickness where she experienced spells of weakness, palpitations, fevers, tachycardia, chills, vomiting, and diarrhea. Currently patient denies symptoms. )   History of Present Illness: Alexis Norton is a 20 y.o. female here for evaluation of positive ANA checked in setting of recurrent fevers, lymphadenopathy, and tachycardia.  She generally felt unwell health although was sick with Covid in January of last year but feels that she returned entirely to her baseline afterwards.  Later last year since around October she suffered multiple episodes of weakness, palpitations, fever, lymphadenopathy, chills, vomiting, and diarrhea this was treated for infection but recurred for a total of 3 episodes.  The third time she was also found to have what sounds like some ulcers or exudative plaques in the mouth or throat. There were no medication changes or travel exposures for this except completing her second Covid vaccine after which she did have fairly strong immediate fevers and aches for a few days. She underwent extensive work-up for this with cardiology and infectious disease with no positive cultures identified and no typical chronic viral infections identified.  CT angiography of the chest was unremarkable.  Since the last episode she has currently returned to entirely normal health the slowest to resolve problem was the episodic tachycardia.  She had long-term cardiac monitor without any significant arrhythmias.  She denies any problems with skin rashes, eye inflammation, pleurisy, and has no history of blood clots and never pregnant.  Labs reviewed 01/2020 ANA 1:80  homogenous HIV neg HCV neg TB neg  12/2019 WBC 11.7  Activities of Daily Living:  Patient reports morning stiffness for 0 minutes.   Patient Denies nocturnal pain.  Difficulty dressing/grooming: Denies Difficulty climbing stairs: Denies Difficulty getting out of chair: Denies Difficulty using hands for taps, buttons, cutlery, and/or writing: Denies  Review of Systems  Constitutional: Negative for fatigue.  HENT: Negative for mouth sores, mouth dryness and nose dryness.   Eyes: Negative for pain, itching, visual disturbance and dryness.  Respiratory: Negative for cough, hemoptysis, shortness of breath and difficulty breathing.   Cardiovascular: Negative for chest pain, palpitations and swelling in legs/feet.  Gastrointestinal: Positive for constipation. Negative for abdominal pain, blood in stool and diarrhea.  Endocrine: Negative for increased urination.  Genitourinary: Negative for painful urination.  Musculoskeletal: Negative for arthralgias, joint pain, joint swelling, myalgias, muscle weakness, morning stiffness, muscle tenderness and myalgias.  Skin: Positive for color change. Negative for rash and redness.  Allergic/Immunologic: Negative for susceptible to infections.  Neurological: Negative for dizziness, numbness, headaches, memory loss and weakness.  Hematological: Negative for swollen glands.  Psychiatric/Behavioral: Negative for confusion and sleep disturbance.    PMFS History:  Patient Active Problem List   Diagnosis Date Noted  . Positive ANA (antinuclear antibody) 03/27/2020  . Food allergy 03/27/2020  . Leukocytosis 03/27/2020  . Fever 02/04/2020  . Tachycardia 02/04/2020  . Encounter for screening for HIV 02/04/2020    Past Medical History:  Diagnosis Date  . Chest tightness   . COVID   . Fever   . Headache   .  Leg pain   . Palpitations   . Pharyngitis   . Respiratory infection   . Sore throat   . Tachycardia     Family History  Problem  Relation Age of Onset  . Hypertension Mother   . Hypertension Father   . Rheum arthritis Paternal Uncle   . Ovarian cancer Maternal Grandmother   . Heart disease Maternal Grandfather   . Celiac disease Paternal Grandmother   . Heart disease Paternal Grandfather    Past Surgical History:  Procedure Laterality Date  . WISDOM TOOTH EXTRACTION  2018   Social History   Social History Narrative  . Not on file   Immunization History  Administered Date(s) Administered  . DTaP 06/17/1999, 08/19/1999, 10/21/1999, 11/05/2000, 11/28/2003  . HPV Quadrivalent 09/16/2010, 02/13/2011, 06/13/2011  . Hepatitis B, ped/adol 07/26/99, 05/16/1999, 10/21/1999  . IPV 06/17/1999, 08/19/1999, 11/05/2000, 11/28/2003  . Influenza, Seasonal, Injecte, Preservative Fre 12/02/2013  . MMR 05/11/2000, 11/28/2003  . Meningococcal B, OMV 09/14/2017, 10/15/2017  . Meningococcal Mcv4o 09/16/2010, 09/14/2017  . PFIZER(Purple Top)SARS-COV-2 Vaccination 09/22/2019, 10/13/2019  . Tdap 09/16/2010, 09/14/2017  . Varicella 06/07/2002, 09/14/2017     Objective: Vital Signs: BP (!) 136/96 (BP Location: Left Arm, Patient Position: Sitting, Cuff Size: Normal)   Pulse 90   Ht 5' 7.5" (1.715 m)   Wt 157 lb (71.2 kg)   BMI 24.23 kg/m    Physical Exam HENT:     Right Ear: External ear normal.     Left Ear: External ear normal.     Mouth/Throat:     Mouth: Mucous membranes are moist.     Pharynx: Oropharynx is clear.  Eyes:     Conjunctiva/sclera: Conjunctivae normal.  Cardiovascular:     Rate and Rhythm: Normal rate and regular rhythm.  Pulmonary:     Effort: Pulmonary effort is normal.     Breath sounds: Normal breath sounds.  Skin:    General: Skin is warm and dry.     Findings: No rash.  Neurological:     General: No focal deficit present.     Mental Status: She is alert.     Deep Tendon Reflexes: Reflexes normal.  Psychiatric:        Mood and Affect: Mood normal.    Musculoskeletal Exam:  Neck  full ROM no tenderness Shoulders full ROM no tenderness or swelling Elbows full ROM no tenderness or swelling Wrists full ROM no tenderness or swelling Fingers full ROM no tenderness or swelling Hip normal internal and external rotation without pain, no tenderness to lateral hip palpation Knees full ROM no tenderness or swelling Ankles full ROM no tenderness or swelling MTPs full ROM no tenderness or swelling   Investigation: No additional findings.  Imaging: LONG TERM MONITOR (3-14 DAYS)  Result Date: 03/07/2020  Patch wear time was 13 days and 22 hours  Predominant rhythm was sinus with average HR 79bpm; ranging from 51bpm to 172bpm.  There were rare SVE (<1%) and rare PVCs (<1%)  There was no SVT, Afib or VT  Patient triggered events correlated with NSR  Overall, no significant arrhythmias on the monitor.  Patch Wear Time:  13 days and 22 hours (2022-01-15T11:39:53-0500 to 2022-01-29T10:20:46-0500) Patient had a min HR of 51 bpm, max HR of 172 bpm, and avg HR of 79 bpm. Predominant underlying rhythm was Sinus Rhythm. Slight P wave morphology changes were noted. Isolated SVEs were rare (<1.0%), SVE Couplets were rare (<1.0%), and no SVE Triplets were present. Isolated VEs were rare (<  1.0%), VE Couplets were rare (<1.0%), and no VE Triplets were present.    Recent Labs: Lab Results  Component Value Date   WBC 11.7 (H) 12/30/2019   HGB 12.5 12/30/2019   PLT 301 12/30/2019   NA 132 (L) 12/30/2019   K 3.6 12/30/2019   CL 97 (L) 12/30/2019   CO2 22 12/30/2019   GLUCOSE 113 (H) 12/30/2019   BUN 5 (L) 12/30/2019   CREATININE 0.84 12/30/2019   BILITOT 0.6 12/30/2019   ALKPHOS 59 12/30/2019   AST 23 12/30/2019   ALT 25 12/30/2019   PROT 7.6 12/30/2019   ALBUMIN 3.6 12/30/2019   CALCIUM 8.9 12/30/2019   QFTBGOLDPLUS NEGATIVE 02/03/2020    Speciality Comments: No specialty comments available.  Procedures:  No procedures performed Allergies: Cefdinir, Ceftriaxone, Penicillin  g, Cephalosporins, Sulfa antibiotics, and Sulfamethoxazole-trimethoprim   Assessment / Plan:     Visit Diagnoses: Positive ANA (antinuclear antibody) - Plan: RNP Antibody, Anti-Smith antibody, Sjogrens syndrome-A extractable nuclear antibody, Sjogrens syndrome-B extractable nuclear antibody, Anti-DNA antibody, double-stranded, C3 and C4  General currently presents no clinical criteria for systemic lupus and is overall in good health. She has positive ANA test and multiple previous episodes with systemic inflammation and no clear infectious agent was identified. We'll check more specific antibodies including RNP, Smith, SSA, SSB, dsDNA, and complement levels today. If serology is negative then no further work-up recommended and we could consider follow-up if symptoms recur.  Fever, unspecified fever cause  Based on the history and the intermittent resolution of her fevers suggest against periodic fever syndrome, checking to rule out problems such as systemic lupus.  Tachycardia  Currently doing well no history of valvular disease no history of echocardiogram. Recent long-term monitor showed no significant arrhythmia only some episodes of tachycardia, she has follow-up planned with cardiology in the upcoming weeks.  Orders: Orders Placed This Encounter  Procedures  . RNP Antibody  . Anti-Smith antibody  . Sjogrens syndrome-A extractable nuclear antibody  . Sjogrens syndrome-B extractable nuclear antibody  . Anti-DNA antibody, double-stranded  . C3 and C4   No orders of the defined types were placed in this encounter.   Follow-Up Instructions: No follow-ups on file.   Collier Salina, MD  Note - This record has been created using [employer].  Chart creation errors have been sought, but may not always  have been located. Such creation errors do not reflect on  the standard of medical care.

## 2020-03-27 ENCOUNTER — Other Ambulatory Visit: Payer: Self-pay

## 2020-03-27 ENCOUNTER — Ambulatory Visit: Payer: BC Managed Care – PPO | Admitting: Internal Medicine

## 2020-03-27 ENCOUNTER — Encounter: Payer: Self-pay | Admitting: Internal Medicine

## 2020-03-27 VITALS — BP 136/96 | HR 90 | Ht 67.5 in | Wt 157.0 lb

## 2020-03-27 DIAGNOSIS — R768 Other specified abnormal immunological findings in serum: Secondary | ICD-10-CM

## 2020-03-27 DIAGNOSIS — R509 Fever, unspecified: Secondary | ICD-10-CM

## 2020-03-27 DIAGNOSIS — D72829 Elevated white blood cell count, unspecified: Secondary | ICD-10-CM | POA: Insufficient documentation

## 2020-03-27 DIAGNOSIS — Z91018 Allergy to other foods: Secondary | ICD-10-CM | POA: Insufficient documentation

## 2020-03-27 DIAGNOSIS — R000 Tachycardia, unspecified: Secondary | ICD-10-CM

## 2020-03-27 NOTE — Patient Instructions (Addendum)
 Anti-DNA Antibody Test Why am I having this test? The anti-DNA antibody test helps with the diagnosis and follow-up of systemic lupus erythematosus (SLE). It is also used to monitor treatment of this condition as the antibody decreases with successful therapy. What is being tested? This test measures the amount of anti-DNA antibody in the blood. This antibody is found in 65-80% of patients with active SLE. This antibody is not as common in patients who have other diseases. What kind of sample is taken? A blood sample is required for this test. It is usually collected by inserting a needle into a blood vessel.   How are the results reported? Your test results will be reported as a value. Your test results may also be reported as positive, intermediate, or negative. Your health care provider will compare your results to normal ranges that were established after testing a large group of people (reference values). Reference values may vary among labs and hospitals. For this test, common reference values are:  Positive: 10 or more international units/mL.  Intermediate: 5-9 international units/mL.  Negative: Less than 5 international units/mL. What do the results mean? Positive results, which are associated with results that are higher than the reference values, may indicate:  Autoimmune disorders such as SLE.  Infectious mononucleosis.  Chronic liver conditions. Intermediate results mean that the anti-DNA antibody levels are higher than normal, but not high enough to be considered positive. Negative results mean that you do not have the anti-DNA antibody that is associated with these conditions. Talk with your health care provider about what your results mean. Questions to ask your health care provider Ask your health care provider, or the department that is doing the test:  When will my results be ready?  How will I get my results?  What are my treatment options?  What other tests  do I need?  What are my next steps? Summary  The anti-DNA antibody test helps with the diagnosis and follow-up of systemic lupus erythematosus (SLE). It is also used to monitor treatment of this condition as the antibody decreases with successful therapy.  This test measures the amount of anti-DNA antibody in the blood.  Elevated levels of anti-DNA antibody can be seen in patients with SLE and certain other conditions.    Complement Assay Test Why am I having this test? Complement refers to a group of proteins that are part of the body's disease-fighting system (immune system). A complement assay test provides information about some or all of these proteins. You may have this test:  To diagnose a lack, or deficiency, of certain complement proteins. Deficiencies can be passed from parent to child (inherited).  To monitor an infection or autoimmune disease.  If you have unexplained inflammation or swelling (edema).  If you have bacterial infections again and again. What is being tested? This test can be used to measure:  Total complement. This is the total number of protein complements in your blood.  The number of each kind of complement in your blood. The nine main kinds of complement are labeled C1 through C9. Some of these complements, such as C3 and C4, are especially important and have many functions in the body. Depending on why you are having the test, your health care provider may test your total complement or only some individual complements, such as C3 and C4. The total complement assay test may be done before individual complements are tested. What kind of sample is taken? A blood sample is required   for this test. It is usually collected by inserting a needle into a blood vessel.   Tell a health care provider about:  Any allergies you have.  All medicines you are taking, including vitamins, herbs, eye drops, creams, and over-the-counter medicines.  Any blood disorders  you have.  Any surgeries you have had.  Any medical conditions you have.  Whether you are pregnant or may be pregnant. How are the results reported? Your results will be reported as a value that tells you how much complement is in your blood. This will be given as units per milliliter of blood (units/mL) or as milligrams per deciliter of blood (mg/dL). Your results may be reported as total complement, or as individual complements, or both. Your health care provider will compare your results to normal ranges that were established after testing a large group of people (reference ranges). Reference ranges may vary among labs and hospitals. For this test, reference ranges for some of the most commonly measured complement assays may be:  Total complement: 30-75 units/mL.  C2: 1-4 mg/dL.  C3: 75-175 mg/dL.  C4: 22-45 units/mL. What do the results mean? Results within reference ranges are considered normal, which means you have a normal amount of complement in your blood. Results that are higher than the reference ranges may be caused by:  Inflammatory disease.  Heart attack.  Cancer. Complement deficiencies, or results lower than the reference ranges, may be caused by:  Certain inherited conditions.  Autoimmune disease.  Certain liver diseases.  Malnutrition.  Certain types of anemia that result in breakdown of red blood cells (hemolytic anemia). Talk with your health care provider about what your results mean. Questions to ask your health care provider Ask your health care provider, or the department that is doing the test:  When will my results be ready?  How will I get my results?  What are my treatment options?  What other tests do I need?  What are my next steps? Summary  Complement refers to a group of proteins that are part of the body's disease-fighting system (immune system). A complement assay test can provide information about some or all of these  proteins.  You may have a complement assay test to help diagnose a complement deficiency, and to monitor some infections or autoimmune disease.  Talk with your health care provider about what your results mean. This information is not intended to replace advice given to you by your health care provider. Make sure you discuss any questions you have with your health care provider. Document Revised: 11/10/2019 Document Reviewed: 11/10/2019 Elsevier Patient Education  2021 [employer]   

## 2020-03-28 LAB — SJOGRENS SYNDROME-A EXTRACTABLE NUCLEAR ANTIBODY: SSA (Ro) (ENA) Antibody, IgG: <1 AI

## 2020-03-28 LAB — C3 AND C4
C3 Complement: 153 mg/dL (ref 83–193)
C4 Complement: 25 mg/dL (ref 15–57)

## 2020-03-28 LAB — SJOGRENS SYNDROME-B EXTRACTABLE NUCLEAR ANTIBODY: SSB (La) (ENA) Antibody, IgG: <1 AI

## 2020-03-28 LAB — ANTI-SMITH ANTIBODY: ENA SM Ab Ser-aCnc: <1 AI

## 2020-03-28 LAB — RNP ANTIBODY: Ribonucleic Protein(ENA) Antibody, IgG: <1 AI

## 2020-03-28 LAB — ANTI-DNA ANTIBODY, DOUBLE-STRANDED: ds DNA Ab: 7 IU/mL — ABNORMAL HIGH

## 2020-03-30 NOTE — Progress Notes (Signed)
Lab tests for lupus or related autoimmune disease are mostly negative. The dsDNA test is borderline. It could be reactive to an infection or medications rather than an independent disease. We can follow up though if symptoms like she had last year started to recur. Otherwise I do not recommend any new medications or testing now.

## 2020-04-18 NOTE — Progress Notes (Unsigned)
Cardiology Office Note:    Date:  04/20/2020   ID:  Alexis Norton, DOB 07-06-2000, MRN [id number]  PCP:  Patient, No Pcp Per    [facility]  Cardiologist:  No primary care provider on file.  Advanced Practice Provider:  No care team member to display Electrophysiologist:  None    Referring MD: No ref. provider found    History of Present Illness:    Alexis Norton is a 20 y.o. female with a hx of COVID in 01/2019 and sinus tachycardia who presents to clinic for follow-up.  Patient was seen in ID clinic for recurrent fevers. Large infectious and rheumatologic work-up performed with no clear source. Thought possibly due to viral illness. During that visit, the patient stated that she had episodes of tachycardia without clear triggers for which she was referred to cardiology for further management. ECG with NSR, early r-wave progression.   During her last visit on 02/07/20, the patient stated that she got the second dose of her COVID vaccine in September. Had fever, body aches and HA x2 days afterwards. 3 weeks later she had high fever, lymphadenopathy, and  myalgias. Took ABX and steroids with some improvement but as soon as she stopped, she again developed fevers, lymphadenopathy and myalgias. She went to the hospital and got another round of ABX for ?cellulitis although did not think she truly had cellulitis. She was okay for approximately 1 month but again woke up with recurrent lymphadenopathy. She went to work and was notified by her watch that her HR was 120s. Sustained >100bpm all day. The next day, she again woke up with fever, myalgias and severe chills at which point she want to [facility]. She had a sore throat on the left side as well and she was diagnosed with severe pharyngitis. She was sent home on ABX. Since then (early December), she has been without fevers but has continued to have episodes of tachycardia. Can come on at anytime and she feels like  her heart is racing out of her chest. Symptoms are becoming more frequent despite improvement of fevers. Not triggered with exercise. She also has been having feelings of weakness like her blood glucose is low. The weakness resolves with eating sugar. Her symptoms of weakness do not always correlate with her tachycardia. She has been followed by Infectious disease but given the continued palpitations, she was referred here for further management.  We obtained a cardiac monitor which showed NSR, rare PVCs and SVE, no significant ectopy.   Today, the patient feels overall well. Has resumed exercising and she feels well doing this. No significant palpitations, fatigue improved. Did follow-up Rheumatologist and work-up reassuringly normal, but advised against a booster shot. No orthopnea, PND, LE edema. No LAD or fevers.   Past Medical History:  Diagnosis Date  . Chest tightness   . COVID   . Fever   . Headache   . Leg pain   . Palpitations   . Pharyngitis   . Respiratory infection   . Sore throat   . Tachycardia     Past Surgical History:  Procedure Laterality Date  . WISDOM TOOTH EXTRACTION  2018    Current Medications: Current Meds  Medication Sig  . ascorbic acid (VITAMIN C) 500 MG tablet Take 500 mg by mouth daily.  . B Complex Vitamins (B COMPLEX PO) Take by mouth daily.  Marland Kitchen ELDERBERRY PO Take 1 tablet by mouth daily.  Marland Kitchen HAWTHORN BERRY PO Take by mouth  daily.  . loratadine (CLARITIN) 10 MG tablet Take 10 mg by mouth at bedtime.  . Magnesium 250 MG TABS Take 2 tablets by mouth daily.  . norgestrel-ethinyl estradiol (LO/OVRAL) 0.3-30 MG-MCG tablet Take 1 tablet by mouth daily.  . Probiotic Product (PROBIOTIC PO) Take 1 capsule by mouth daily.  . Vitamin D, Cholecalciferol, 25 MCG (1000 UT) TABS Take 1 tablet by mouth daily.  Marland Kitchen. ZINC CITRATE PO Take by mouth.     Allergies:   Cefdinir, Ceftriaxone, Penicillin g, Cephalosporins, Sulfa antibiotics, and Sulfamethoxazole-trimethoprim    Social History   Socioeconomic History  . Marital status: Single    Spouse name: Not on file  . Number of children: Not on file  . Years of education: Not on file  . Highest education level: Not on file  Occupational History  . Not on file  Tobacco Use  . Smoking status: Never Smoker  . Smokeless tobacco: Never Used  Vaping Use  . Vaping Use: Never used  Substance and Sexual Activity  . Alcohol use: Yes    Comment: Occassionally  . Drug use: Not Currently  . Sexual activity: Not on file  Other Topics Concern  . Not on file  Social History Narrative  . Not on file   Social Determinants of Health   Financial Resource Strain: Not on file  Food Insecurity: Not on file  Transportation Needs: Not on file  Physical Activity: Not on file  Stress: Not on file  Social Connections: Not on file     Family History: The patient's family history includes Celiac disease in her paternal grandmother; Heart disease in her maternal grandfather and paternal grandfather; Hypertension in her father and mother; Ovarian cancer in her maternal grandmother; Rheum arthritis in her paternal uncle.  ROS:   Please see the history of present illness.    Review of Systems  Constitutional: Negative for chills and fever.  HENT: Negative for hearing loss.   Eyes: Negative for blurred vision and redness.  Respiratory: Negative for shortness of breath.   Cardiovascular: Negative for chest pain, palpitations, orthopnea, claudication, leg swelling and PND.  Gastrointestinal: Negative for melena, nausea and vomiting.  Genitourinary: Negative for dysuria and flank pain.  Musculoskeletal: Negative for falls.  Neurological: Negative for dizziness and loss of consciousness.  Endo/Heme/Allergies: Negative for polydipsia.  Psychiatric/Behavioral: Negative for substance abuse.    EKGs/Labs/Other Studies Reviewed:    The following studies were reviewed today: Cardiac Monitor 03/06/20:  Patch wear time  was 13 days and 22 hours  Predominant rhythm was sinus with average HR 79bpm; ranging from 51bpm to 172bpm.  There were rare SVE (<1%) and rare PVCs (<1%)  There was no SVT, Afib or VT  Patient triggered events correlated with NSR  Overall, no significant arrhythmias on the monitor.   CT PE Protocol 12/31/19: FINDINGS: Cardiovascular: Thoracic aorta is well visualized. No significant aneurysmal dilatation of the thoracic aorta is noted. No cardiac enlargement is seen. No significant coronary calcifications are noted. The pulmonary artery shows a normal branching pattern. No filling defect to suggest pulmonary embolism is seen.  Mediastinum/Nodes: Thoracic inlet is within normal limits. No sizable hilar or mediastinal adenopathy is noted. Residual thymus tissue is noted anteriorly. The esophagus as visualized is within normal limits.  Lungs/Pleura: The lungs are well aerated bilaterally. No focal infiltrate or sizable effusion is seen.  Upper Abdomen: Visualized upper abdomen is unremarkable.  Musculoskeletal: No acute bony abnormality noted.  Review of the MIP images confirms  the above findings.  IMPRESSION: No evidence of pulmonary emboli.   Recent Labs: 12/30/2019: ALT 25; BUN 5; Creatinine, Ser 0.84; Hemoglobin 12.5; Platelets 301; Potassium 3.6; Sodium 132 02/03/2020: TSH 1.34  Recent Lipid Panel No results found for: CHOL, TRIG, HDL, CHOLHDL, VLDL, LDLCALC, LDLDIRECT    Physical Exam:    VS:  BP 112/80 (BP Location: Left Arm, Patient Position: Sitting, Cuff Size: Normal)   Pulse 92   Ht 5' 7.5" (1.715 m)   Wt 154 lb (69.9 kg)   SpO2 99%   BMI 23.76 kg/m     Wt Readings from Last 3 Encounters:  04/20/20 154 lb (69.9 kg)  03/27/20 157 lb (71.2 kg)  02/07/20 153 lb 3.2 oz (69.5 kg)     GEN:  Well nourished, well developed in no acute distress HEENT: Normal NECK: No JVD; No carotid bruits CARDIAC: RRR, no murmurs, rubs, gallops RESPIRATORY:  Clear  to auscultation without rales, wheezing or rhonchi  ABDOMEN: Soft, non-tender, non-distended MUSCULOSKELETAL:  No edema; No deformity  SKIN: Warm and dry NEUROLOGIC:  Alert and oriented x 3 PSYCHIATRIC:  Normal affect   ASSESSMENT:    1. Palpitations   2. Tachycardia   3. Fever of unknown origin    PLAN:    In order of problems listed above:  #Palpitations #Tachycardia: Significantly improved. Patient initially presented with recurrent fevers, lympadenopathy, and myalgias that began in October as detailed above.Had significant palpitations associated with these symptoms with HR 120-160s without known triggers. Fortunately, symptoms have since resolve and zio monitor with NSR with no significant ectopy or arrythmias -Symptoms significantly improved -Maintain adequate hydration and avoid excessive caffeine -Small snacks throughout the day to avoid hypoglycemia as patient has episodes of weakness that resolves with eating -Not anemic, TSH normal -CTA negative for PE  #Recurrent fevers with lymphadenopathy: Resolved. Followed by ID and rheum -Follow-up with ID as scheduled -Follow-up with rheum as scheduled -Advised against COVID booster given her significant response     Medication Adjustments/Labs and Tests Ordered: Current medicines are reviewed at length with the patient today.  Concerns regarding medicines are outlined above.  No orders of the defined types were placed in this encounter.  No orders of the defined types were placed in this encounter.   Patient Instructions  Medication Instructions:   *If you need a refill on your cardiac medications before your next appointment, please call your pharmacy*   Lab Work:  If you have labs (blood work) drawn today and your tests are completely normal, you will receive your results only by: Marland Kitchen MyChart Message (if you have MyChart) OR . A paper copy in the mail If you have any lab test that is abnormal or we need to  change your treatment, we will call you to review the results.   Testing/Procedures:    Follow-Up: At [facility], you and your health needs are our priority.  As part of our continuing mission to provide you with exceptional heart care, we have created designated Provider Care Teams.  These Care Teams include your primary Cardiologist (physician) and Advanced Practice Providers (APPs -  Physician Assistants and Nurse Practitioners) who all work together to provide you with the care you need, when you need it.  We recommend signing up for the patient portal called "MyChart".  Sign up information is provided on this After Visit Summary.  MyChart is used to connect with patients for Virtual Visits (Telemedicine).  Patients are able to view lab/test results, encounter  notes, upcoming appointments, etc.  Non-urgent messages can be sent to your provider as well.   To learn more about what you can do with MyChart, go to [email or web address].    Your next appointment:  As needed  \    Signed, Meriam Sprague, MD  04/20/2020 11:03 AM     [facility]

## 2020-04-20 ENCOUNTER — Other Ambulatory Visit: Payer: Self-pay

## 2020-04-20 ENCOUNTER — Encounter: Payer: Self-pay | Admitting: Cardiology

## 2020-04-20 ENCOUNTER — Ambulatory Visit: Payer: BC Managed Care – PPO | Admitting: Internal Medicine

## 2020-04-20 ENCOUNTER — Ambulatory Visit: Payer: BC Managed Care – PPO | Admitting: Cardiology

## 2020-04-20 VITALS — BP 112/80 | HR 92 | Ht 67.5 in | Wt 154.0 lb

## 2020-04-20 DIAGNOSIS — R509 Fever, unspecified: Secondary | ICD-10-CM

## 2020-04-20 DIAGNOSIS — R000 Tachycardia, unspecified: Secondary | ICD-10-CM | POA: Diagnosis not present

## 2020-04-20 DIAGNOSIS — R002 Palpitations: Secondary | ICD-10-CM

## 2020-04-20 NOTE — Patient Instructions (Signed)
Medication Instructions:   *If you need a refill on your cardiac medications before your next appointment, please call your pharmacy*   Lab Work:  If you have labs (blood work) drawn today and your tests are completely normal, you will receive your results only by: MyChart Message (if you have MyChart) OR A paper copy in the mail If you have any lab test that is abnormal or we need to change your treatment, we will call you to review the results.   Testing/Procedures:    Follow-Up: At [facility], you and your health needs are our priority.  As part of our continuing mission to provide you with exceptional heart care, we have created designated Provider Care Teams.  These Care Teams include your primary Cardiologist (physician) and Advanced Practice Providers (APPs -  Physician Assistants and Nurse Practitioners) who all work together to provide you with the care you need, when you need it.  We recommend signing up for the patient portal called "MyChart".  Sign up information is provided on this After Visit Summary.  MyChart is used to connect with patients for Virtual Visits (Telemedicine).  Patients are able to view lab/test results, encounter notes, upcoming appointments, etc.  Non-urgent messages can be sent to your provider as well.   To learn more about what you can do with MyChart, go to [email or web address].    Your next appointment:  As needed    

## 2021-01-27 HISTORY — PX: TONSILLECTOMY: SUR1361

## 2021-02-08 ENCOUNTER — Telehealth: Payer: Self-pay | Admitting: *Deleted

## 2021-02-08 NOTE — Telephone Encounter (Signed)
° °  Pre-operative Risk Assessment    Patient Name: Alexis Norton  DOB: 2000-08-28 MRN: [id number]      Request for Surgical Clearance    Procedure:   TONSILECTOMY  Date of Surgery:  Clearance TBD                                 Surgeon:   Ike Bene, MD Surgeon's Group or Practice Name:   [facility], [facility] Phone number:   Fax number:   [phone]   Type of Clearance Requested:   - Medical    Type of Anesthesia:  Not Indicated   Additional requests/questions:      Alexis Norton   02/08/2021, 12:12 PM

## 2021-02-08 NOTE — Telephone Encounter (Addendum)
° °  Name: Alexis Norton  DOB: 23-Oct-2000  MRN: [id number]   Primary Cardiologist: Meriam Sprague, MD  Chart reviewed as part of pre-operative protocol coverage. Patient was contacted 02/08/2021 in reference to pre-operative risk assessment for pending surgery as outlined below.  Alexis Norton was last seen on 04/20/2020 by Dr. Shari Prows.  Since that day, Alexis Norton has done well without exertional chest pain or worsening dyspnea.  She has not had any recent palpitation, tachycardia, dizziness, or any limitation to her functional ability.  She is able to accomplish more than 4 METS of activity without any issue.  Therefore, based on ACC/AHA guidelines, the patient would be at acceptable risk for the planned procedure without further cardiovascular testing.   The patient was advised that if she develops new symptoms prior to surgery to contact our office to arrange for a follow-up visit, and she verbalized understanding.  I will route this recommendation to the requesting party via Epic fax function and remove from pre-op pool. Please call with questions.  [place], [place] 02/08/2021, 4:54 PM

## 2021-08-30 ENCOUNTER — Ambulatory Visit (INDEPENDENT_AMBULATORY_CARE_PROVIDER_SITE_OTHER): Payer: 59 | Admitting: Internal Medicine

## 2021-08-30 ENCOUNTER — Encounter: Payer: Self-pay | Admitting: Internal Medicine

## 2021-08-30 VITALS — BP 118/84 | HR 94 | Temp 98.5°F | Ht 67.5 in | Wt 160.2 lb

## 2021-08-30 DIAGNOSIS — Z0000 Encounter for general adult medical examination without abnormal findings: Secondary | ICD-10-CM | POA: Diagnosis not present

## 2021-08-30 DIAGNOSIS — Z880 Allergy status to penicillin: Secondary | ICD-10-CM

## 2021-08-30 DIAGNOSIS — Z881 Allergy status to other antibiotic agents status: Secondary | ICD-10-CM | POA: Insufficient documentation

## 2021-08-30 HISTORY — DX: Allergy status to penicillin: Z88.0

## 2021-08-30 LAB — CBC
HCT: 38.5 % (ref 36.0–46.0)
Hemoglobin: 13.3 g/dL (ref 12.0–15.0)
MCHC: 34.5 g/dL (ref 30.0–36.0)
MCV: 88.7 fl (ref 78.0–100.0)
Platelets: 376 10*3/uL (ref 150.0–400.0)
RBC: 4.35 Mil/uL (ref 3.87–5.11)
RDW: 12.4 % (ref 11.5–15.5)
WBC: 7 10*3/uL (ref 4.0–10.5)

## 2021-08-30 LAB — LIPID PANEL
Cholesterol: 193 mg/dL (ref 0–200)
HDL: 57.2 mg/dL (ref >39.00)
LDL Cholesterol: 114 mg/dL — ABNORMAL HIGH (ref 0–99)
NonHDL: 136.16
Total CHOL/HDL Ratio: 3
Triglycerides: 110 mg/dL (ref 0.0–149.0)
VLDL: 22 mg/dL (ref 0.0–40.0)

## 2021-08-30 LAB — COMPREHENSIVE METABOLIC PANEL
ALT: 19 U/L (ref 0–35)
AST: 20 U/L (ref 0–37)
Albumin: 4.8 g/dL (ref 3.5–5.2)
Alkaline Phosphatase: 46 U/L (ref 39–117)
BUN: 10 mg/dL (ref 6–23)
CO2: 28 mEq/L (ref 19–32)
Calcium: 9.9 mg/dL (ref 8.4–10.5)
Chloride: 102 mEq/L (ref 96–112)
Creatinine, Ser: 0.87 mg/dL (ref 0.40–1.20)
GFR: 94.59 mL/min (ref >60.00)
Glucose, Bld: 77 mg/dL (ref 70–99)
Potassium: 4.8 mEq/L (ref 3.5–5.1)
Sodium: 139 mEq/L (ref 135–145)
Total Bilirubin: 0.5 mg/dL (ref 0.2–1.2)
Total Protein: 7.9 g/dL (ref 6.0–8.3)

## 2021-08-30 NOTE — Progress Notes (Signed)
Complete physical exam  Patient: Alexis Norton   DOB: [birth year]   21 y.o. Female  MRN: [id number]  Subjective:    Chief Complaint  Patient presents with   Establish Care    Pt here to establish care with a PCP. No concerns. Wants to have a physical done for work.    Alexis Norton is a 21 y.o. female who presents today for a complete physical exam. She reports consuming a general diet. She exercise at home with home exercise equipment. She generally feels well. She reports sleeping well. She does not have additional problems to discuss today.    Most recent fall risk assessment:     No data to display          She denies any fall in the last year  Most recent depression screenings:    08/30/2021   10:13 AM  PHQ 2/9 Scores  PHQ - 2 Score 0      Had eye appt 2 weeks ago going for glasses  Reports following q6 months with dental She declines STD testing cis, female, monogamous, straight, no high risk sex recently.      Outpatient Medications Prior to Visit  Medication Sig   ascorbic acid (VITAMIN C) 500 MG tablet Take 500 mg by mouth daily.   B Complex Vitamins (B COMPLEX PO) Take by mouth daily.   ELDERBERRY PO Take 1 tablet by mouth daily.   fluticasone (FLONASE) 50 MCG/ACT nasal spray Place 1 spray into both nostrils daily.   Magnesium 250 MG TABS Take 2 tablets by mouth daily.   norgestrel-ethinyl estradiol (LO/OVRAL) 0.3-30 MG-MCG tablet Take 1 tablet by mouth daily.   Probiotic Product (PROBIOTIC PO) Take 1 capsule by mouth daily.   Vitamin D, Cholecalciferol, 25 MCG (1000 UT) TABS Take 1 tablet by mouth daily.   [DISCONTINUED] HAWTHORN BERRY PO Take by mouth daily.   [DISCONTINUED] loratadine (CLARITIN) 10 MG tablet Take 10 mg by mouth at bedtime.   [DISCONTINUED] ZINC CITRATE PO Take by mouth.   No facility-administered medications prior to visit.   Review of Systems  Constitutional:  Negative for chills, fever, malaise/fatigue and  weight loss.  HENT:  Negative for ear pain, hearing loss and tinnitus.   Eyes:  Negative for blurred vision and double vision.  Respiratory:  Negative for cough, hemoptysis, shortness of breath and wheezing.   Cardiovascular:  Negative for chest pain and palpitations.  Gastrointestinal:  Negative for heartburn and nausea.  Genitourinary:  Negative for dysuria, frequency, hematuria and urgency.  Musculoskeletal:  Negative for back pain, myalgias and neck pain.  Skin:  Negative for itching and rash.  Neurological:  Negative for dizziness, tingling, tremors and headaches.  Endo/Heme/Allergies:  Does not bruise/bleed easily.  Psychiatric/Behavioral:  Negative for depression, hallucinations and suicidal ideas. The patient does not have insomnia.           Objective:     BP 118/84   Pulse 94   Temp 98.5 F (36.9 C) (Temporal)   Ht 5' 7.5" (1.715 m)   Wt 160 lb 3.2 oz (72.7 kg)   SpO2 99%   BMI 24.72 kg/m    '@PHYSICALEXAM' @  Physical Exam: Constitutional: NAD, AAO, not ill-appearing HENT:  NCAT, normal nose, mucous membranes moist Eyes:  nonicteric, no injection CV:  RRR, no mrg Lung: CTAB, normal wob Abd: soft, nondistended, no guarding Skin: warm, dry, no lesions of concern Neuro: alert, no focal deficit obvious, articulate speech Psych: normal mood,  behavior, thought content    No results found for any visits on 08/30/21.     Assessment & Plan:    Routine Health Maintenance and Physical Exam  Immunization History  Administered Date(s) Administered   DTaP 06/17/1999, 08/19/1999, 10/21/1999, 11/05/2000, 11/28/2003   HPV Quadrivalent 09/16/2010, 02/13/2011, 06/13/2011   Hepatitis B, ped/adol September 25, 1999, 05/16/1999, 10/21/1999   IPV 06/17/1999, 08/19/1999, 11/05/2000, 11/28/2003   Influenza, Seasonal, Injecte, Preservative Fre 12/02/2013   MMR 05/11/2000, 11/28/2003   Meningococcal B, OMV 09/14/2017, 10/15/2017   Meningococcal Mcv4o 09/16/2010, 09/14/2017    PFIZER(Purple Top)SARS-COV-2 Vaccination 09/22/2019, 10/13/2019   Tdap 09/16/2010, 09/14/2017   Varicella 06/07/2002, 09/14/2017    Health Maintenance  Topic Date Due   PAP-Cervical Cytology Screening  10/16/2021 (Originally 04/07/2020)   PAP SMEAR-Modifier  10/16/2021 (Originally 04/07/2020)   INFLUENZA VACCINE  11/23/2021 (Originally 08/27/2021)   TETANUS/TDAP  09/15/2027   HPV VACCINES  Completed   Hepatitis C Screening  Completed   HIV Screening  Completed   COVID-19 Vaccine  Discontinued    Discussed health benefits of physical activity, and encouraged her to engage in regular exercise appropriate for her age and condition.  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  We developed a goal together today, of increasing exercise, by using the evidence based guidance below.  We also discussed that she is "at risk" on alcohol consumption.   She wants to lose weight for her upcoming wedding.   Goals Addressed             This Visit's Progress    Activity and Exercise Increased       Evidence-based guidance:  Review current exercise levels.  Assess patient perspective on exercise or activity level, barriers to increasing activity, motivation and readiness for change.  Recommend or set healthy exercise goal based on individual tolerance.  Encourage small steps toward making change in amount of exercise or activity.  Urge reduction of sedentary activities or screen time.  Promote group activities within the community or with family or support person.  Consider referral to rehabiliation therapist for assessment and exercise/activity plan.   Notes:          Return in about 1 year (around 08/31/2022).      Loralee Pacas, MD

## 2021-09-02 ENCOUNTER — Encounter: Payer: Self-pay | Admitting: Internal Medicine

## 2021-09-02 NOTE — Progress Notes (Signed)
Please send a low cholesterol diet.

## 2021-09-18 ENCOUNTER — Telehealth: Payer: 59 | Admitting: Physician Assistant

## 2021-09-18 ENCOUNTER — Other Ambulatory Visit (HOSPITAL_COMMUNITY): Payer: Self-pay

## 2021-09-18 DIAGNOSIS — R3989 Other symptoms and signs involving the genitourinary system: Secondary | ICD-10-CM | POA: Diagnosis not present

## 2021-09-18 MED ORDER — NITROFURANTOIN MONOHYD MACRO 100 MG PO CAPS
100.0000 mg | ORAL_CAPSULE | Freq: Two times a day (BID) | ORAL | 0 refills | Status: DC
  Filled 2021-09-18: qty 10, 5d supply, fill #0

## 2021-09-18 NOTE — Progress Notes (Signed)

## 2021-09-18 NOTE — Progress Notes (Signed)
I have spent 5 minutes in review of e-visit questionnaire, review and updating patient chart, medical decision making and response to patient.   Duke Weisensel Cody Kiante Ciavarella, PA-C    

## 2021-10-16 ENCOUNTER — Other Ambulatory Visit: Payer: Self-pay

## 2021-10-16 MED ORDER — LOW-OGESTREL 0.3-30 MG-MCG PO TABS
1.0000 | ORAL_TABLET | Freq: Every day | ORAL | 3 refills | Status: DC
  Filled 2021-10-16: qty 84, 84d supply, fill #0
  Filled 2022-01-04: qty 84, 84d supply, fill #1
  Filled 2022-03-30: qty 84, 84d supply, fill #2
  Filled 2022-06-23: qty 84, 84d supply, fill #3

## 2021-10-16 MED ORDER — CIPROFLOXACIN HCL 500 MG PO TABS
ORAL_TABLET | ORAL | 0 refills | Status: DC
  Filled 2021-10-16: qty 14, 7d supply, fill #0

## 2021-10-17 ENCOUNTER — Other Ambulatory Visit: Payer: Self-pay

## 2022-01-06 ENCOUNTER — Other Ambulatory Visit: Payer: Self-pay

## 2022-01-09 ENCOUNTER — Encounter: Payer: Self-pay | Admitting: *Deleted

## 2022-06-24 ENCOUNTER — Other Ambulatory Visit: Payer: Self-pay

## 2022-06-25 ENCOUNTER — Other Ambulatory Visit: Payer: Self-pay

## 2022-07-13 ENCOUNTER — Telehealth: Payer: 59 | Admitting: Nurse Practitioner

## 2022-07-13 DIAGNOSIS — J069 Acute upper respiratory infection, unspecified: Secondary | ICD-10-CM

## 2022-07-13 MED ORDER — FLUTICASONE PROPIONATE 50 MCG/ACT NA SUSP: 1.0000 | Freq: Every day | NASAL | 0 refills | Status: AC

## 2022-07-13 NOTE — Progress Notes (Signed)
E-Visit for Upper Respiratory Infection   We are sorry you are not feeling well.  Here is how we plan to help!  Providers prescribe antibiotics to treat infections caused by bacteria. Antibiotics are very powerful in treating bacterial infections when they are used properly. To maintain their effectiveness, they should be used only when necessary. Overuse of antibiotics has resulted in the development of superbugs that are resistant to treatment!    After careful review of your answers, I would not recommend an antibiotic for your condition.  Antibiotics are not effective against viruses and therefore should not be used to treat them. Common examples of infections caused by viruses include colds and flu   Based on what you have shared with me, it looks like you may have a viral upper respiratory infection.  Upper respiratory infections are caused by a large number of viruses; however, rhinovirus is the most common cause.   Symptoms vary from person to person, with common symptoms including sore throat, cough, fatigue or lack of energy and feeling of general discomfort.  A low-grade fever of up to 100.4 may present, but is often uncommon.  Symptoms vary however, and are closely related to a person's age or underlying illnesses.  The most common symptoms associated with an upper respiratory infection are nasal discharge or congestion, cough, sneezing, headache and pressure in the ears and face.  These symptoms usually persist for about 3 to 10 days, but can last up to 2 weeks.  It is important to know that upper respiratory infections do not cause serious illness or complications in most cases.    Upper respiratory infections can be transmitted from person to person, with the most common method of transmission being a person's hands.  The virus is able to live on the skin and can infect other persons for up to 2 hours after direct contact.  Also, these can be transmitted when someone coughs or sneezes;  thus, it is important to cover the mouth to reduce this risk.  To keep the spread of the illness at bay, good hand hygiene is very important.  This is an infection that is most likely caused by a virus. There are no specific treatments other than to help you with the symptoms until the infection runs its course.  We are sorry you are not feeling well.  Here is how we plan to help!   For nasal congestion, you may use an oral decongestants such as Mucinex D or if you have glaucoma or high blood pressure use plain Mucinex.  Saline nasal spray or nasal drops can help and can safely be used as often as needed for congestion.  For your congestion, I recommend you continue flonase.   If you do not have a history of heart disease, hypertension, diabetes or thyroid disease, prostate/bladder issues or glaucoma, you may also use Sudafed to treat nasal congestion.  It is highly recommended that you consult with a pharmacist or your primary care physician to ensure this medication is safe for you to take.     You can take motrin or tylenol for headache.   If you have a sore or scratchy throat, use a saltwater gargle-  to  teaspoon of salt dissolved in a 4-ounce to 8-ounce glass of warm water.  Gargle the solution for approximately 15-30 seconds and then spit.  It is important not to swallow the solution.  You can also use throat lozenges/cough drops and Chloraseptic spray to help with throat  pain or discomfort.  Warm or cold liquids can also be helpful in relieving throat pain.  For headache, pain or general discomfort, you can use Ibuprofen or Tylenol as directed.   Some authorities believe that zinc sprays or the use of Echinacea may shorten the course of your symptoms.   HOME CARE Only take medications as instructed by your medical team. Be sure to drink plenty of fluids. Water is fine as well as fruit juices, sodas and electrolyte beverages. You may want to stay away from caffeine or alcohol. If you are  nauseated, try taking small sips of liquids. How do you know if you are getting enough fluid? Your urine should be a pale yellow or almost colorless. Get rest. Taking a steamy shower or using a humidifier may help nasal congestion and ease sore throat pain. You can place a towel over your head and breathe in the steam from hot water coming from a faucet. Using a saline nasal spray works much the same way. Cough drops, hard candies and sore throat lozenges may ease your cough. Avoid close contacts especially the very young and the elderly Cover your mouth if you cough or sneeze Always remember to wash your hands.   GET HELP RIGHT AWAY IF: You develop worsening fever. If your symptoms do not improve within 10 days You develop yellow or green discharge from your nose over 3 days. You have coughing fits You develop a severe head ache or visual changes. You develop shortness of breath, difficulty breathing or start having chest pain Your symptoms persist after you have completed your treatment plan  MAKE SURE YOU  Understand these instructions. Will watch your condition. Will get help right away if you are not doing well or get worse.  Thank you for choosing an e-visit.  Your e-visit answers were reviewed by a board certified advanced clinical practitioner to complete your personal care plan. Depending upon the condition, your plan could have included both over the counter or prescription medications.  Please review your pharmacy choice. Make sure the pharmacy is open so you can pick up prescription now. If there is a problem, you may contact your provider through [employer] and have the prescription routed to another pharmacy.  Your safety is important to [place]. If you have drug allergies check your prescription carefully.   For the next 24 hours you can use MyChart to ask questions about today's visit, request a non-urgent call back, or ask for a work or school excuse. You will get an  email in the next two days asking about your experience. I hope that your e-visit has been valuable and will speed your recovery.

## 2022-07-13 NOTE — Progress Notes (Signed)
I have spent 5 minutes in review of e-visit questionnaire, review and updating patient chart, medical decision making and response to patient.  ° °Brendan Gruwell W Kevin Mario, NP ° °  °

## 2022-08-05 IMAGING — CR DG CHEST 2V
2 series · 2 of 2 positions shown · non-contrast
Comparison: None.

CLINICAL DATA: Fever, chest tightness since [REDACTED]

EXAM:
CHEST - 2 VIEW

[chest pa]
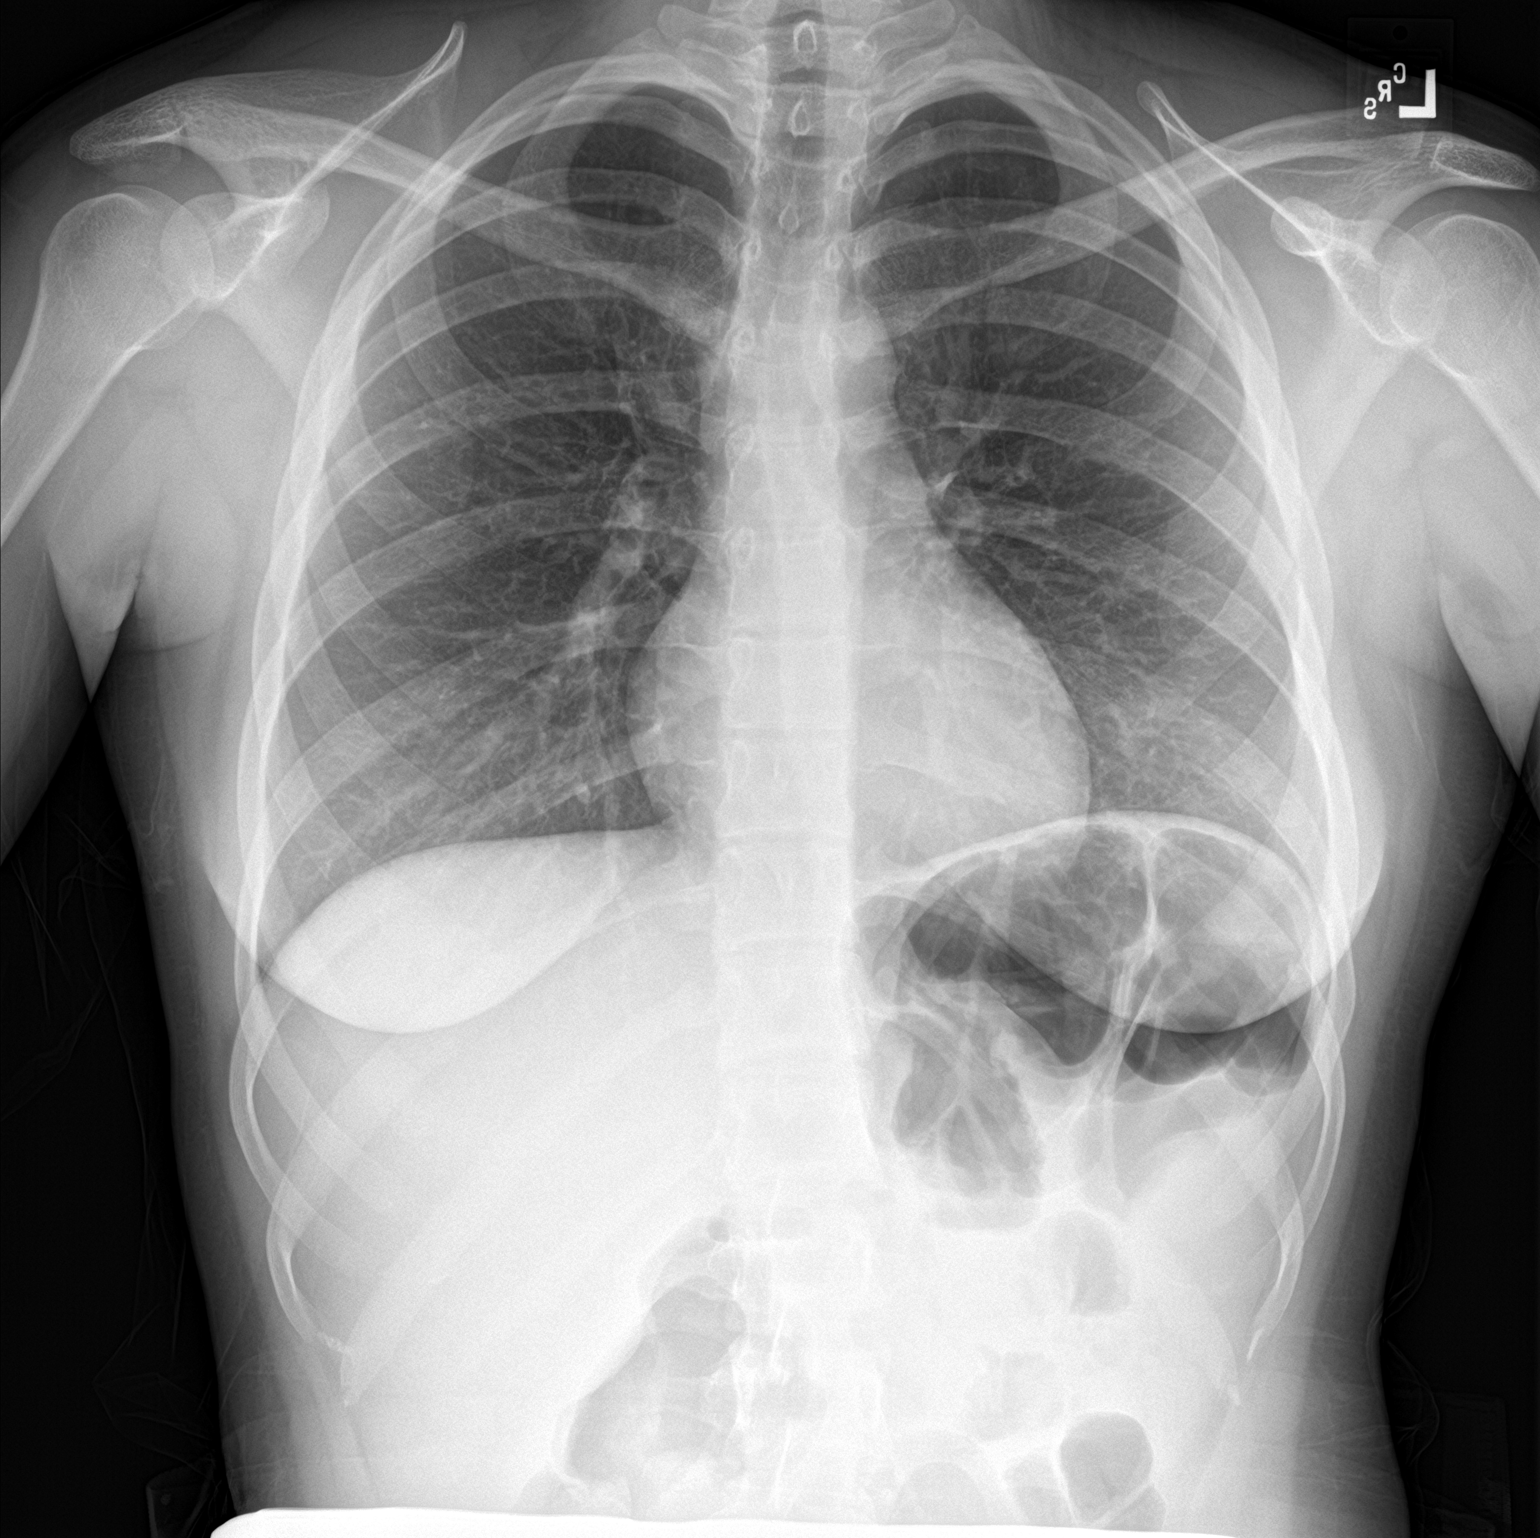

[chest lat]
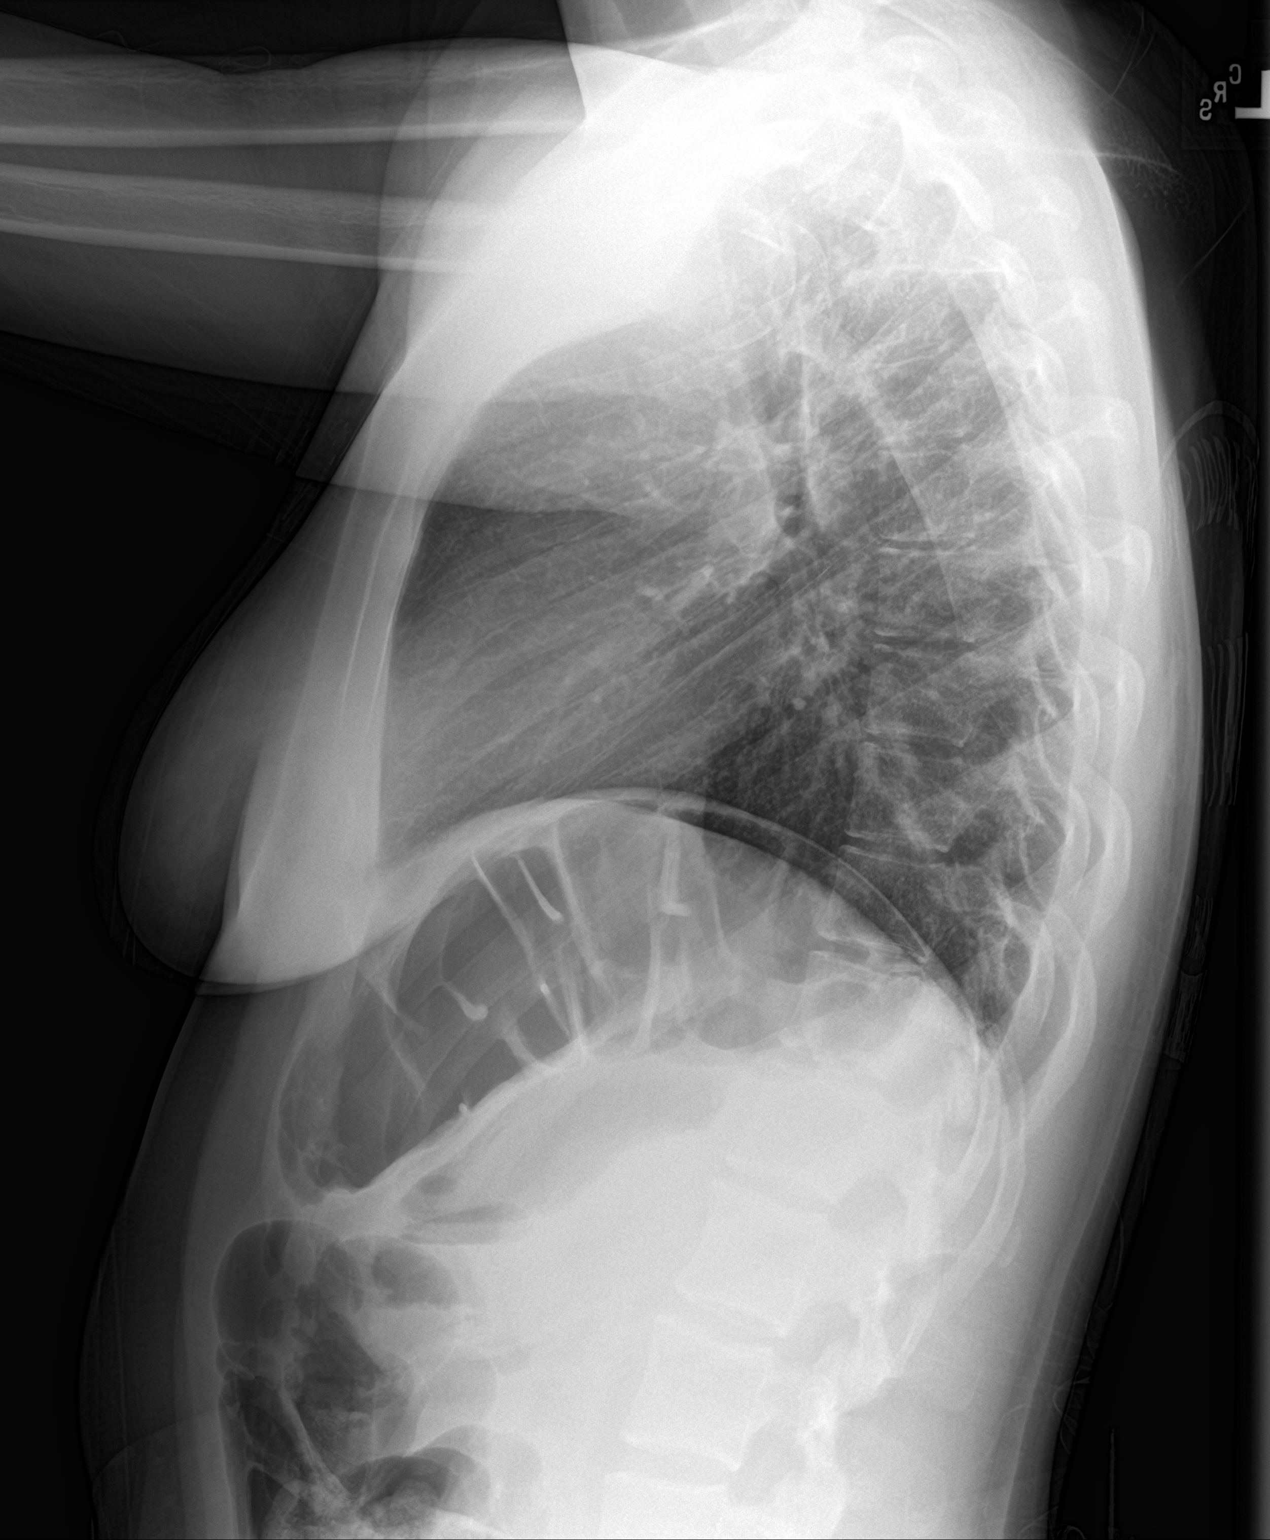

[2 of 2 positions shown; findings below may reference images not displayed]

FINDINGS: Few clustered punctate micro nodules are seen in the right lung. No
focal consolidative opacity is seen. No pneumothorax or effusion.
The cardiomediastinal contours are unremarkable. No acute osseous or
soft tissue abnormality.
IMPRESSION: Indeterminate clustered punctate micronodules in the right lung,
could reflect atypical infection or sequela prior granulomatous
disease.

No other acute cardiopulmonary abnormality.

## 2022-08-06 IMAGING — CT CT ANGIO CHEST
2 of 7 series · 19 of 46 positions shown · IV contrast (omnipaque)
Comparison: Chest x-ray from the previous day.

CLINICAL DATA: Chest pain and pressure for several weeks, initial
encounter

EXAM:
CT ANGIOGRAPHY CHEST WITH CONTRAST
TECHNIQUE: Multidetector CT imaging of the chest was performed using the
standard protocol during bolus administration of intravenous
contrast. Multiplanar CT image reconstructions and MIPs were
obtained to evaluate the vascular anatomy.
CONTRAST:  80mL OMNIPAQUE IOHEXOL 350 MG/ML SOLN

[Series 8: thins · axial · 0.61mm/px · z∈[-257,-26]mm · 16 of 373 slices shown]
[im 21/373  lung]
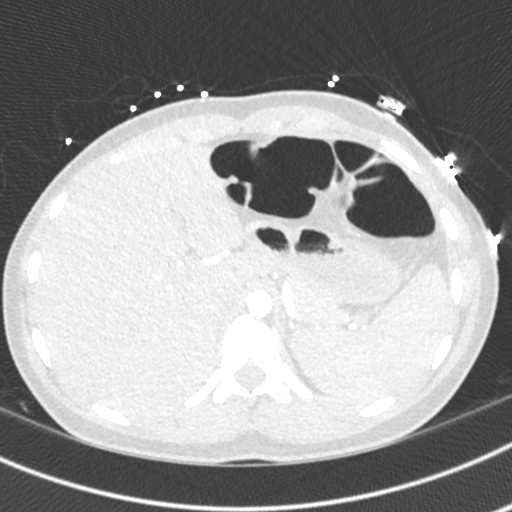
[im 42/373  soft-tissue]
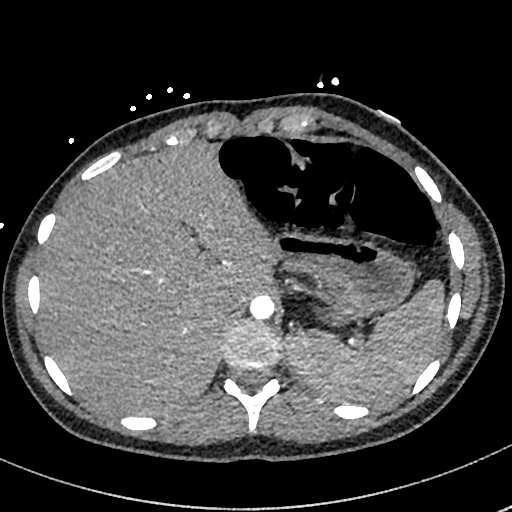
[im 63/373  lung]
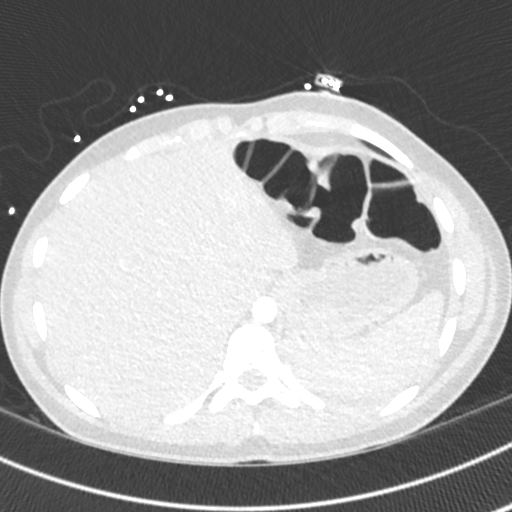
[im 83/373  soft-tissue]
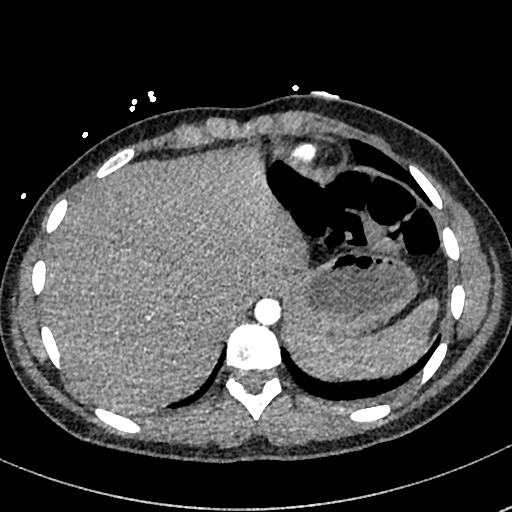
[im 104/373  lung]
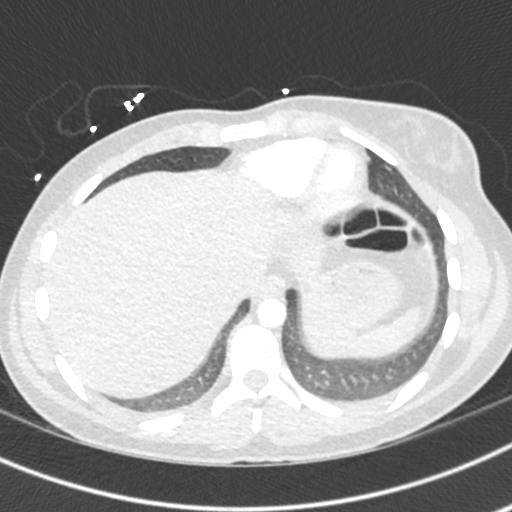
[im 125/373  soft-tissue]
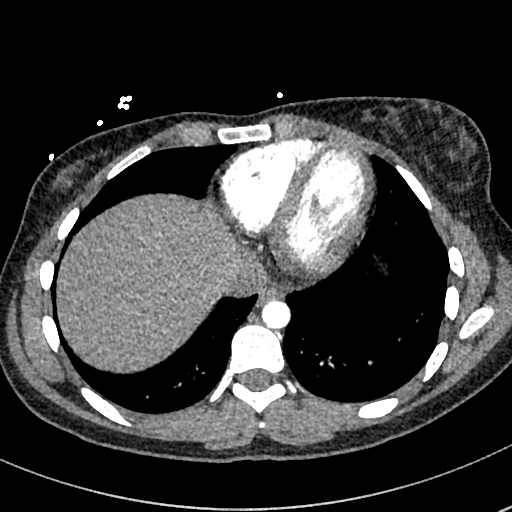
[im 145/373  lung]
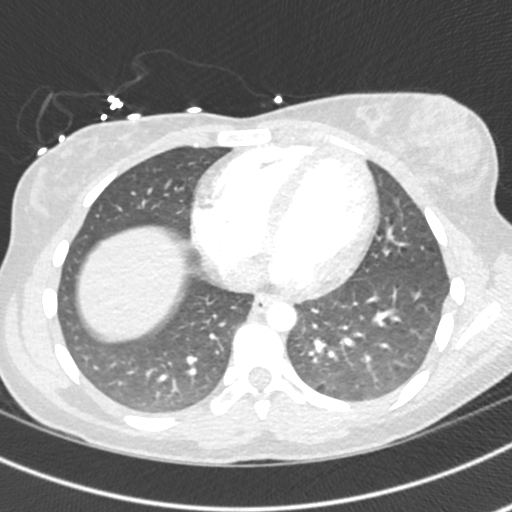
[im 166/373  soft-tissue]
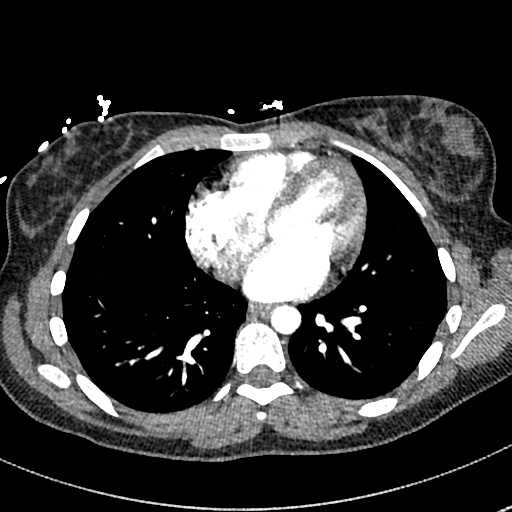
[im 207/373  lung]
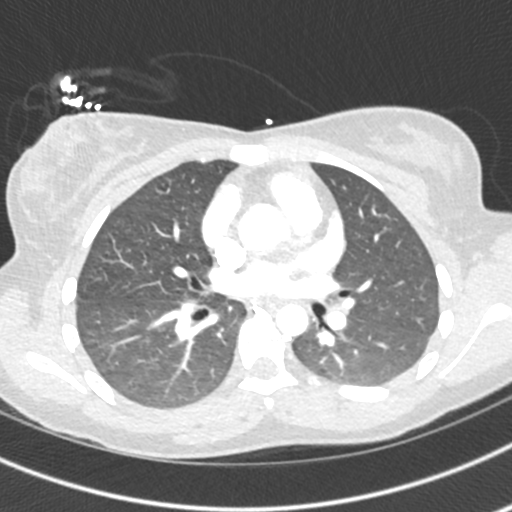
[im 228/373  soft-tissue]
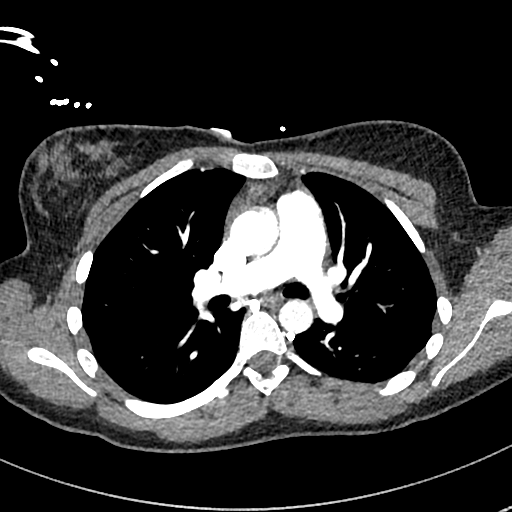
[im 249/373  lung]
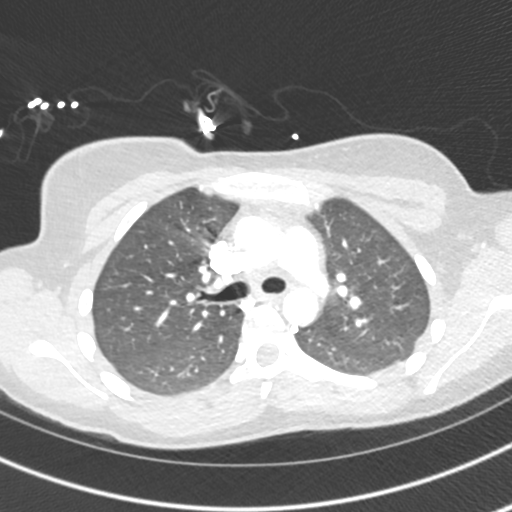
[im 269/373  soft-tissue]
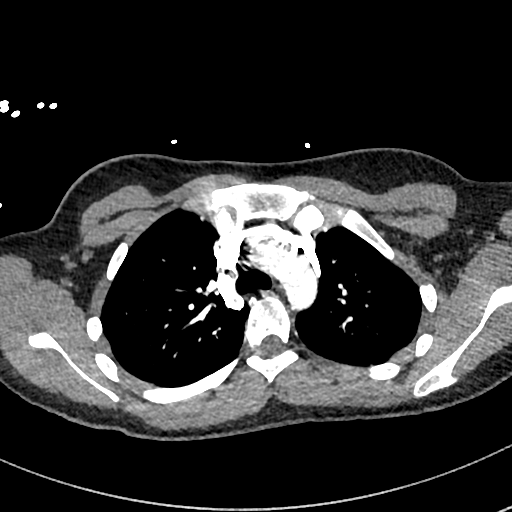
[im 290/373  lung]
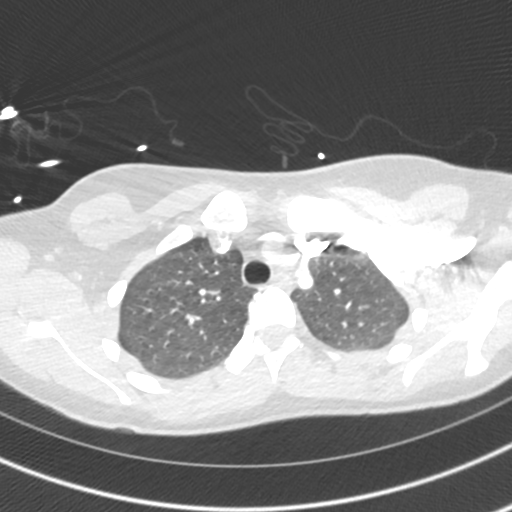
[im 311/373  soft-tissue]
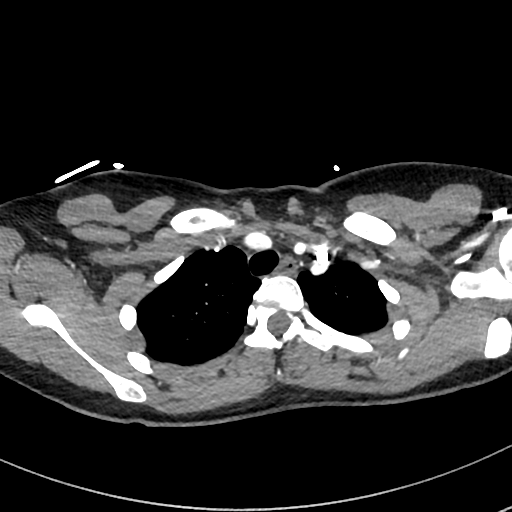
[im 331/373  lung]
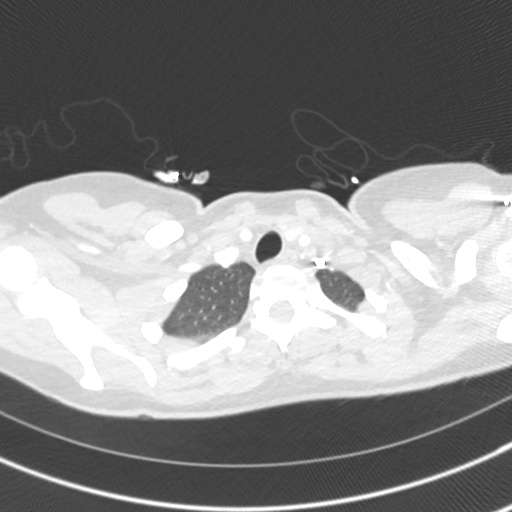
[im 352/373  soft-tissue]
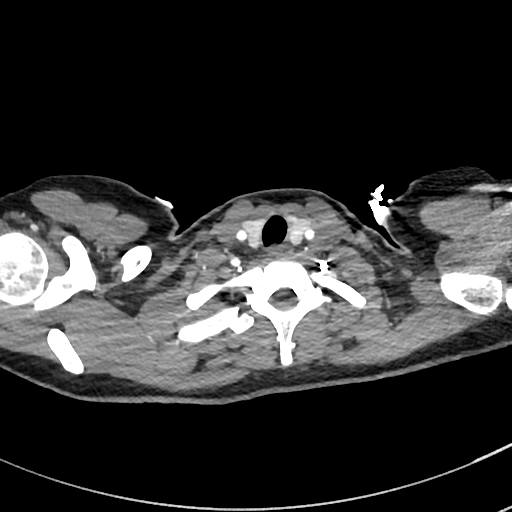

[Series 9: cor · coronal · 0.61mm/px · 3 of 118 slices shown]
[im 30/118  soft-tissue]
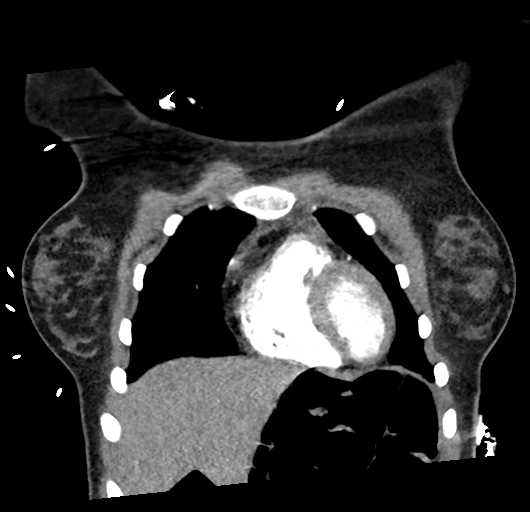
[im 59/118  soft-tissue]
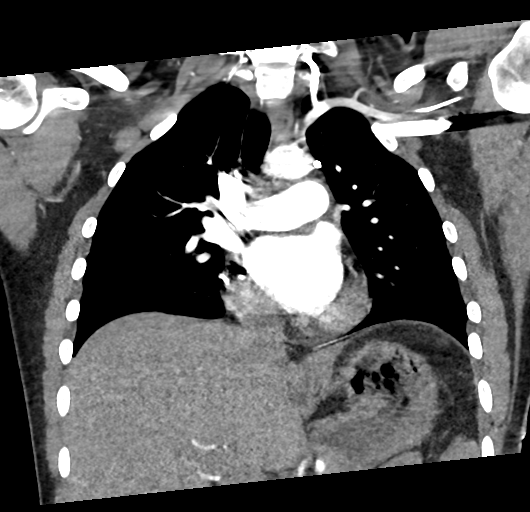
[im 88/118  soft-tissue]
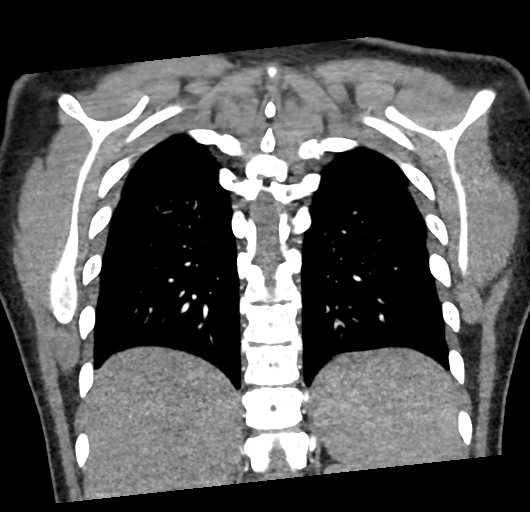

[19 of 46 positions shown; findings below may reference images not displayed]

FINDINGS: Cardiovascular: Thoracic aorta is well visualized. No significant
aneurysmal dilatation of the thoracic aorta is noted. No cardiac
enlargement is seen. No significant coronary calcifications are
noted. The pulmonary artery shows a normal branching pattern. No
filling defect to suggest pulmonary embolism is seen.

Mediastinum/Nodes: Thoracic inlet is within normal limits. No
sizable hilar or mediastinal adenopathy is noted. Residual thymus
tissue is noted anteriorly. The esophagus as visualized is within
normal limits.

Lungs/Pleura: The lungs are well aerated bilaterally. No focal
infiltrate or sizable effusion is seen.

Upper Abdomen: Visualized upper abdomen is unremarkable.

Musculoskeletal: No acute bony abnormality noted.

Review of the MIP images confirms the above findings.
IMPRESSION: No evidence of pulmonary emboli.

No other focal abnormality is noted.

## 2022-08-20 DIAGNOSIS — H5213 Myopia, bilateral: Secondary | ICD-10-CM | POA: Diagnosis not present

## 2022-09-02 ENCOUNTER — Ambulatory Visit (INDEPENDENT_AMBULATORY_CARE_PROVIDER_SITE_OTHER): Payer: 59 | Admitting: Internal Medicine

## 2022-09-02 ENCOUNTER — Encounter: Payer: Self-pay | Admitting: Internal Medicine

## 2022-09-02 VITALS — BP 128/86 | HR 87 | Temp 98.1°F | Ht 67.5 in | Wt 159.0 lb

## 2022-09-02 DIAGNOSIS — E7800 Pure hypercholesterolemia, unspecified: Secondary | ICD-10-CM

## 2022-09-02 DIAGNOSIS — Z0000 Encounter for general adult medical examination without abnormal findings: Secondary | ICD-10-CM

## 2022-09-02 DIAGNOSIS — G43909 Migraine, unspecified, not intractable, without status migrainosus: Secondary | ICD-10-CM

## 2022-09-02 DIAGNOSIS — Z808 Family history of malignant neoplasm of other organs or systems: Secondary | ICD-10-CM | POA: Diagnosis not present

## 2022-09-02 DIAGNOSIS — E049 Nontoxic goiter, unspecified: Secondary | ICD-10-CM

## 2022-09-02 DIAGNOSIS — E785 Hyperlipidemia, unspecified: Secondary | ICD-10-CM | POA: Insufficient documentation

## 2022-09-02 HISTORY — DX: Migraine, unspecified, not intractable, without status migrainosus: G43.909

## 2022-09-02 LAB — COMPREHENSIVE METABOLIC PANEL
ALT: 12 U/L (ref 0–35)
AST: 15 U/L (ref 0–37)
Albumin: 4.9 g/dL (ref 3.5–5.2)
Alkaline Phosphatase: 39 U/L (ref 39–117)
BUN: 11 mg/dL (ref 6–23)
CO2: 24 mEq/L (ref 19–32)
Calcium: 10.1 mg/dL (ref 8.4–10.5)
Chloride: 102 mEq/L (ref 96–112)
Creatinine, Ser: 0.86 mg/dL (ref 0.40–1.20)
GFR: 95.23 mL/min (ref >60.00)
Glucose, Bld: 84 mg/dL (ref 70–99)
Potassium: 4.3 mEq/L (ref 3.5–5.1)
Sodium: 137 mEq/L (ref 135–145)
Total Bilirubin: 0.6 mg/dL (ref 0.2–1.2)
Total Protein: 8.3 g/dL (ref 6.0–8.3)

## 2022-09-02 LAB — TSH: TSH: 2.14 u[IU]/mL (ref 0.35–5.50)

## 2022-09-02 LAB — LIPID PANEL
Cholesterol: 192 mg/dL (ref 0–200)
HDL: 63.5 mg/dL (ref >39.00)
LDL Cholesterol: 105 mg/dL — ABNORMAL HIGH (ref 0–99)
NonHDL: 128.26
Total CHOL/HDL Ratio: 3
Triglycerides: 114 mg/dL (ref 0.0–149.0)
VLDL: 22.8 mg/dL (ref 0.0–40.0)

## 2022-09-02 LAB — CBC WITH DIFFERENTIAL/PLATELET
Basophils Absolute: 0.1 10*3/uL (ref 0.0–0.1)
Basophils Relative: 0.7 % (ref 0.0–3.0)
Eosinophils Absolute: 0.2 10*3/uL (ref 0.0–0.7)
Eosinophils Relative: 2.2 % (ref 0.0–5.0)
HCT: 40.5 % (ref 36.0–46.0)
Hemoglobin: 13.6 g/dL (ref 12.0–15.0)
Lymphocytes Relative: 37.9 % (ref 12.0–46.0)
Lymphs Abs: 3.2 10*3/uL (ref 0.7–4.0)
MCHC: 33.7 g/dL (ref 30.0–36.0)
MCV: 88.8 fl (ref 78.0–100.0)
Monocytes Absolute: 0.4 10*3/uL (ref 0.1–1.0)
Monocytes Relative: 4.9 % (ref 3.0–12.0)
Neutro Abs: 4.6 10*3/uL (ref 1.4–7.7)
Neutrophils Relative %: 54.3 % (ref 43.0–77.0)
Platelets: 397 10*3/uL (ref 150.0–400.0)
RBC: 4.56 Mil/uL (ref 3.87–5.11)
RDW: 12.5 % (ref 11.5–15.5)
WBC: 8.4 10*3/uL (ref 4.0–10.5)

## 2022-09-02 NOTE — Progress Notes (Signed)
Phone [phone]  Annual Preventive Exam  Date:    09/02/2022   Patient Name:  Alexis Norton  Patient Care Team: Lula Olszewski, MD as PCP - General (Internal Medicine) Meriam Sprague, MD as PCP - Cardiology (Cardiology) Paylor, Tawanna Sat, MD as Judy Pimple (Student) Today's Healthcare Provider: Lula Olszewski, MD   Chief Complaint / Reason For Visit:   Annual Preventive Exam  Assessment / Plan:   Ticara was seen today for annual exam.  Family history of malignant melanoma -     Ambulatory referral to Dermatology -     TSH -     Comprehensive metabolic panel -     CBC with Differential/Platelet  Pure hypercholesterolemia -     Lipid panel  Enlarged thyroid   Today's preventive care visit included comprehensive health maintenance evaluation and gap closure alongside extensive anticipatory guidance, as follows: #  Eye exams: recommended every 1-2 years.   She voiced understanding and intent to adhere to that plan.just did. #  Dental health: recommended regular tooth brushing, flossing, and dental visits every 6 months.  She voiced understanding and intent to adhere to that plan.next appointment October. #  Sinus health: nightly SimplySaline or similar recommended now.  She voiced understanding and intent to adhere to that plan. #  Diet/Exercise:  recommended regular exercise (150 min) and diet rich and fruits and vegetables and fiber and healthy fats to reduce risk of heart attack and stroke. She voiced understanding and intent to adhere to this plan.  She does 3x weekly elliptical in her basement and girl on youtube she says diet is regular tries to eat healthy. Protein and salads. #  Sexuality: recommended STD prevention via partner selection & condoms.  Offered contraception / STD checks / genital wart treatment if appropriate.   #  Cervical cancer screening: Recommended pap smear with hpv every 5 yrs from 25-65.  Offered gynecology referral or  completion here in accordance with patient preferences. #  Breast cancer screening:   encouraged compliance with recommended screening guidelines.  last breast exam September 2023 and last mammogram too young by guideline. #  Uterine/ovarian cancer screening:  discussed current lack of screening guidelines or insurance support for testing.  Explained signs of endometrial cancer are irregular bleeding, postmenopausal bleeding, or pelvic pain.  Signs of ovarian cancer might be a frequent swollen tummy or feeling bloated. pain or tenderness in your tummy or the area between the hips (pelvis), no appetite or feeling full quickly after eating, or an urgent need to pee or needing to pee more often. #  Thyroid cancer screening:  discussed need to palpate thyroid about every 6 months for nodules and we did it today, no nodules. XR tech risxk. #  Skin cancer screening: recommended regular sunscreen use. she denies worrisome, changing, or new skin lesions. We photographed a spot on her back that is not concerning and she would like referral to dermatology for mother had malignant melanoma recently diagnosed. #  Substance use: recommended absolute abstinence from all vaped or smoked recreational or illicit substances of abuse such as tobacco, nicotine, alcohol, illicit drugs, even sugar.  #  Safety:  recommended avoiding high risk activities, wear seat belts, use smoke detectors #  Health maintenance and immunizations reviewed and she was encouraged to complete any incomplete and release any missing immunization records for [place]. Immunization History  Administered Date(s) Administered   DTaP 06/17/1999, 08/19/1999, 10/21/1999, 11/05/2000, 11/28/2003   HPV Quadrivalent 09/16/2010, 02/13/2011, 06/13/2011  Hepatitis B, PED/ADOLESCENT Nov 18, 2000, 05/16/1999, 10/21/1999   IPV 06/17/1999, 08/19/1999, 11/05/2000, 11/28/2003   Influenza, Seasonal, Injecte, Preservative Fre 12/02/2013   MMR 05/11/2000, 11/28/2003    Meningococcal B, OMV 09/14/2017, 10/15/2017   Meningococcal Mcv4o 09/16/2010, 09/14/2017   PFIZER(Purple Top)SARS-COV-2 Vaccination 09/22/2019, 10/13/2019   Tdap 09/16/2010, 09/14/2017   Varicella 06/07/2002, 09/14/2017  #  We attempted to update vaccination records and provide any missing vaccines that are recommended. Health Maintenance Due  Topic Date Due   PAP-Cervical Cytology Screening  Never done   PAP SMEAR-Modifier  Never done  She had pap smear after 20, but we don't have on records, she will send [place] records.  # Incomplete health maintenance issues listed above were brought up for discussion and she was encouraged to complete with our assistance. # Recommended follow up:  continued annual preventive health maintenance exams  Subjective Clinical Information:   Alexis Norton is a 22 y.o. female who presents today for her annual comprehensive physical exam.   She has the following specific preventive medicine concerns for today: mother got malignant melanoma, she works with [employer]  She has the following specific medical problems she would like to discuss: none  Review of Systems  Constitutional:  Negative for chills, diaphoresis, fever, malaise/fatigue and weight loss.  HENT:  Negative for congestion, ear discharge, ear pain, hearing loss, nosebleeds, sinus pain, sore throat and tinnitus.   Eyes:  Negative for blurred vision, double vision, photophobia, pain, discharge and redness.  Respiratory:  Negative for cough, hemoptysis, sputum production, shortness of breath, wheezing and stridor.   Cardiovascular:  Negative for chest pain, palpitations, orthopnea, claudication, leg swelling and PND.  Gastrointestinal:  Negative for abdominal pain, blood in stool, constipation, diarrhea, heartburn, melena, nausea and vomiting.  Genitourinary:  Negative for dysuria, flank pain, frequency, hematuria and urgency.  Musculoskeletal:  Negative for back pain, falls, joint pain, myalgias and  neck pain.  Skin:  Negative for itching and rash.  Neurological:  Negative for dizziness, tingling, tremors, sensory change, speech change, focal weakness, seizures, loss of consciousness, weakness and headaches.  Endo/Heme/Allergies:  Negative for environmental allergies and polydipsia. Does not bruise/bleed easily.  Psychiatric/Behavioral:  Negative for depression, hallucinations, memory loss, substance abuse and suicidal ideas. The patient is not nervous/anxious and does not have insomnia.     During today's preventive visit, the patient has expressed that any positive findings in the review of systems are related to chronic conditions that are stable.  The patient has requested that these issues not be addressed in this visit, with the understanding that they are part of ongoing management and do not require additional workup at this time.  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/2021   Food allergy 03/27/2020   Past Medical History:  Diagnosis Date   Allergy    Chest tightness    COVID    Febrile seizures (HCC)    Fever    Headache    Leg pain    Migraine 09/02/2022   Palpitations    Penicillin allergy 08/30/2021   Pharyngitis    Respiratory infection    Seizures (HCC)    Sore throat    Tachycardia     Past Surgical History:  Procedure Laterality Date  TONSILLECTOMY Bilateral 2023   for recurrent tonsillitis   WISDOM TOOTH EXTRACTION  2019   Family History  Problem Relation Age of Onset   Hypertension Mother    Hypercholesterolemia Mother    Cancer Mother    Hypertension Father    Arthritis Father    Hypercholesterolemia Father    Rheum arthritis Paternal Uncle    Ovarian cancer Maternal  Grandmother    Hypertension Maternal Grandmother    Hypercholesterolemia Maternal Grandmother    Heart disease Maternal Grandfather    Cancer Maternal Grandfather    Diabetes Maternal Grandfather    Hearing loss Maternal Grandfather    Heart attack Maternal Grandfather    Congenital heart disease Maternal Grandfather    Hypercholesterolemia Maternal Grandfather    Hypertension Maternal Grandfather    Celiac disease Paternal Grandmother    Hypercholesterolemia Paternal Grandmother    Stroke Paternal Grandmother    Heart disease Paternal Grandfather    Asthma Paternal Grandfather    COPD Paternal Grandfather    Heart attack Paternal Grandfather    Arthritis Paternal Uncle    Stroke Paternal Uncle    Outpatient Medications Prior to Visit  Medication Sig Dispense Refill   ascorbic acid (VITAMIN C) 500 MG tablet Take 500 mg by mouth daily.     Cholecalciferol (VITAMIN D3) 25 MCG (1000 UT) CAPS daily.     ciprofloxacin (CIPRO) 500 MG tablet Take 1 tablet every 12 hours by oral route for 7 days. 14 tablet 0   ELDERBERRY PO Take 1 tablet by mouth daily.     fexofenadine (ALLEGRA ALLERGY) 180 MG tablet Take 1 tablet every day by oral route.     fluticasone (FLONASE) 50 MCG/ACT nasal spray Place 1 spray into both nostrils daily. 18 g 0   Magnesium 200 MG TABS 2 tablet orally once daily     norgestrel-ethinyl estradiol (LOW-OGESTREL) 0.3-30 MG-MCG tablet Take 1 tablet by mouth daily. 84 tablet 3   Probiotic Product (PROBIOTIC PO) Take 1 capsule by mouth daily.     Vitamin D, Cholecalciferol, 25 MCG (1000 UT) TABS Take 1 tablet by mouth daily.     B Complex Vitamins (B COMPLEX PO) Take by mouth daily.     Magnesium 250 MG TABS Take 2 tablets by mouth daily.     nitrofurantoin, macrocrystal-monohydrate, (MACROBID) 100 MG capsule Take 1 capsule (100 mg total) by mouth 2 (two) times daily. 10 capsule 0   norgestrel-ethinyl estradiol (LO/OVRAL) 0.3-30 MG-MCG tablet Take 1 tablet by mouth daily.      No facility-administered medications prior to visit.   I attest the medication list was reviewed and reconciled with her in accordance with the requirement. Allergies  Allergen Reactions   Cefdinir Anaphylaxis   Ceftriaxone Anaphylaxis   Penicillin G Hives and Anaphylaxis   Cephalosporins Hives and Itching   Sulfa Antibiotics Hives and Itching   Sulfamethoxazole-Trimethoprim Hives, Itching and Rash   Succinylsulphathiazole Hives    Social History   Tobacco Use   Smoking status: Never   Smokeless tobacco: Never  Vaping Use   Vaping status: Never Used  Substance Use Topics   Alcohol use: Yes    Alcohol/week: 1.0 standard drink of alcohol    Types: 1 Glasses of wine per week    Comment: Casual/social on the weekends   Drug use: Not Currently       Objective:  BP 128/86 (BP Location: Left Arm, Patient Position: Sitting)   Pulse 87   Temp 98.1 F (36.7  C) (Temporal)   Ht 5' 7.5" (1.715 m)   Wt 159 lb (72.1 kg)   SpO2 100%   BMI 24.54 kg/m  Body mass index is 24.54 kg/m.  Wt Readings from Last 3 Encounters:  09/02/22 159 lb (72.1 kg)  08/30/21 160 lb 3.2 oz (72.7 kg)  04/20/20 154 lb (69.9 kg)    This is a polite, friendly, and genuine person Constitutional: NAD, AAO, not ill-appearing  HENT:  NCAT, normal nose, mucous membranes moist.  Tympanic membrane evaluated and normal appearing bilaterally, oropharynx evaluated and normal appearing Eyes:  sclera nonicteric, no injection CV:  RRR, no murmurs/rubs/gallops Lung: CTAB, normal WOB, no audible wheezing or stridor Abd: soft, nondistended, no guarding, no palpable tumor Skin: warm, dry, no lesions of concern Neuro: alert, no focal deficit obvious, articulate speech, patellar reflexes equal and pronounced Psych: normal mood, behavior, thought content   Took photo of benign appearing mole on back  Photographs Taken 09/02/2022 :      Patient expressed a preference to do breast and gynecological exam at another  office which  I supported and encouraged by emphasizing  the importance of routine gynecological screenings.

## 2022-09-05 ENCOUNTER — Encounter: Payer: 59 | Admitting: Internal Medicine

## 2022-09-18 ENCOUNTER — Emergency Department (HOSPITAL_COMMUNITY)
Admission: EM | Admit: 2022-09-18 | Discharge: 2022-09-18 | Disposition: A | Discharge status: Home / Self Care | Payer: PRIVATE HEALTH INSURANCE | Attending: Emergency Medicine | Admitting: Emergency Medicine

## 2022-09-18 ENCOUNTER — Emergency Department (HOSPITAL_COMMUNITY): Payer: PRIVATE HEALTH INSURANCE

## 2022-09-18 DIAGNOSIS — S6992XA Unspecified injury of left wrist, hand and finger(s), initial encounter: Secondary | ICD-10-CM | POA: Diagnosis not present

## 2022-09-18 DIAGNOSIS — Z23 Encounter for immunization: Secondary | ICD-10-CM | POA: Diagnosis not present

## 2022-09-18 DIAGNOSIS — W231XXA Caught, crushed, jammed, or pinched between stationary objects, initial encounter: Secondary | ICD-10-CM | POA: Diagnosis not present

## 2022-09-18 DIAGNOSIS — Y9389 Activity, other specified: Secondary | ICD-10-CM | POA: Insufficient documentation

## 2022-09-18 MED ORDER — ACETAMINOPHEN 325 MG PO TABS
650.0000 mg | ORAL_TABLET | Freq: Once | ORAL | Status: AC
  Administered 2022-09-18: 650 mg via ORAL
  Filled 2022-09-18: qty 2

## 2022-09-18 MED ORDER — TETANUS-DIPHTH-ACELL PERTUSSIS 5-2.5-18.5 LF-MCG/0.5 IM SUSY
0.5000 mL | PREFILLED_SYRINGE | Freq: Once | INTRAMUSCULAR | Status: AC
  Administered 2022-09-18: 0.5 mL via INTRAMUSCULAR
  Filled 2022-09-18: qty 0.5

## 2022-09-18 MED ORDER — OXYCODONE HCL 5 MG PO TABS
5.0000 mg | ORAL_TABLET | Freq: Once | ORAL | Status: AC
  Administered 2022-09-18: 5 mg via ORAL
  Filled 2022-09-18: qty 1

## 2022-09-18 MED ORDER — LIDOCAINE HCL (PF) 1 % IJ SOLN
5.0000 mL | Freq: Once | INTRAMUSCULAR | Status: DC
  Filled 2022-09-18: qty 5

## 2022-09-18 NOTE — ED Triage Notes (Signed)
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 Pt relays bleeding when event occurred. Bleeding controled at this time. Pt has PMS in extremity.

## 2022-09-18 NOTE — ED Provider Notes (Signed)
[place] EMERGENCY DEPARTMENT AT [facility] Provider Note   CSN: [id number] Arrival date & time: 09/18/22  1446     History  Chief Complaint  Patient presents with   Finger Injury    Zyniya Nidia Feather is a 22 y.o. female.  HPI   Patient is otherwise healthy 22 year old female presenting today for right middle finger injury.  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  She notes that it lifted her fingernail off of the nailbed.  The fingernail has since detached fully.  She denies any further injuries.  She is unsure of the date of her last tetanus shot.  Upon chart review was 09/14/2017.  She states area is very sensitive but she is able to bend it without significant difficulty.  Home Medications Prior to Admission medications   Medication Sig Start Date End Date Taking? Authorizing Provider  ascorbic acid (VITAMIN C) 500 MG tablet Take 500 mg by mouth daily.    [provider]  Cholecalciferol (VITAMIN D3) 25 MCG (1000 UT) CAPS daily.    [provider]  ciprofloxacin (CIPRO) 500 MG tablet Take 1 tablet every 12 hours by oral route for 7 days. 10/16/21     ELDERBERRY PO Take 1 tablet by mouth daily.    [provider]  fexofenadine (ALLEGRA ALLERGY) 180 MG tablet Take 1 tablet every day by oral route.    [provider]  fluticasone (FLONASE) 50 MCG/ACT nasal spray Place 1 spray into both nostrils daily. 07/13/22   Claiborne Rigg, NP  Magnesium 200 MG TABS 2 tablet orally once daily    [provider]  norgestrel-ethinyl estradiol (LOW-OGESTREL) 0.3-30 MG-MCG tablet Take 1 tablet by mouth daily. 10/16/21     Probiotic Product (PROBIOTIC PO) Take 1 capsule by mouth daily.    [provider]  Vitamin D, Cholecalciferol, 25 MCG (1000 UT) TABS Take 1 tablet by mouth daily.    [provider]      Allergies    Cefdinir, Ceftriaxone, Penicillin g,  Cephalosporins, Sulfa antibiotics, Sulfamethoxazole-trimethoprim, and Succinylsulphathiazole    Review of Systems   Review of Systems Negative except as noted above in HPI  Physical Exam Updated Vital Signs BP 126/76 (BP Location: Right Arm)   Pulse 77   Temp 98.5 F (36.9 C) (Oral)   Resp 16   Ht 5\' 6"  (1.676 m)   Wt 72 kg   SpO2 99%   BMI 25.62 kg/m  Physical Exam Vitals and nursing note reviewed.  Constitutional:      General: She is not in acute distress.    Appearance: She is well-developed.  HENT:     Head: Normocephalic and atraumatic.  Eyes:     Conjunctiva/sclera: Conjunctivae normal.  Cardiovascular:     Rate and Rhythm: Normal rate and regular rhythm.     Heart sounds: No murmur heard. Pulmonary:     Effort: Pulmonary effort is normal. No respiratory distress.     Breath sounds: Normal breath sounds.     Comments: Saturating well on room air Abdominal:     Palpations: Abdomen is soft.     Tenderness: There is no abdominal tenderness.  Musculoskeletal:        General: No swelling.     Cervical back: Neck supple.     Comments: Tenderness present to the right third finger over the distal phalanx.  Nail was fully detached from the nailbed.  No  injuries present to the nailbed present.  Skin:    General: Skin is warm and dry.     Capillary Refill: Capillary refill takes less than 2 seconds.  Neurological:     Mental Status: She is alert and oriented to person, place, and time.  Psychiatric:        Mood and Affect: Mood normal.     ED Results / Procedures / Treatments   Labs (all labs ordered are listed, but only abnormal results are displayed) Labs Reviewed - No data to display  EKG None  Radiology DG Finger Middle Left  Result Date: 09/18/2022 CLINICAL DATA:  Pain, injury EXAM: LEFT MIDDLE FINGER 2+V COMPARISON:  None Available. FINDINGS: Frontal, oblique, and lateral views of the left third digit are obtained. No acute fracture, subluxation, or  dislocation. Joint spaces are well preserved. Soft tissues are unremarkable. IMPRESSION: 1. Unremarkable left third digit. Electronically Signed   By: Sharlet Salina M.D.   On: 09/18/2022 16:24    Procedures .Nail Removal  Date/Time: 09/19/2022 12:27 AM  Performed by: Rhys Martini, DO Authorized by: Charlynne Pander, MD   Consent:    Consent obtained:  Verbal   Consent given by:  Patient   Risks, benefits, and alternatives were discussed: yes   Universal protocol:    Patient identity confirmed:  Verbally with patient and arm band Pre-procedure details:    Skin preparation:  Chlorhexidine Procedure details:    Location:  Hand   Hand location:  L long finger Anesthesia:    Anesthesia method:  Nerve block   Block needle gauge:  24 G   Block anesthetic:  Lidocaine 1% w/o epi   Block technique:  Digital block   Block outcome:  Anesthesia achieved Post-procedure details:    Dressing:  Xeroform gauze   Procedure completion:  Tolerated well, no immediate complications Comments:     Nail was already removed at the time of patient arrival.  No evidence of injuries to the nailbed.  Using the patient's nail, the proximal nailbed was stented open with the nail.  It was then sutured down laterally with 6-0 Prolene.  No acute complications.     Medications Ordered in ED Medications  acetaminophen (TYLENOL) tablet 650 mg (650 mg Oral Given 09/18/22 1604)  Tdap (BOOSTRIX) injection 0.5 mL (0.5 mLs Intramuscular Given 09/18/22 1604)  oxyCODONE (Oxy IR/ROXICODONE) immediate release tablet 5 mg (5 mg Oral Given 09/18/22 1825)    ED Course/ Medical Decision Making/ A&P                                Medical Decision Making Amount and/or Complexity of Data Reviewed Radiology: ordered.  Risk OTC drugs. Prescription drug management.    Patient is otherwise healthy 22 year old female presenting today for right middle finger injury.  On exam, patient has nail detached from the right  third digit without any obvious injuries to the nailbed itself.  Tdap is updated as she had her most recent tetanus shot 5 years ago.  X-ray of the left third digit performed without any acute fracture or malalignment.  On exam, she has no evidence of injuries to the nailbed.  Digital block was performed as described above.  Nail was then used to stent open the proximal nailbed.  It was sutured with 1 stitch on either side.  Patient tolerated well.  Patient is given care instructions regarding the wound.  She will follow-up  closely with hand surgery.  She is provided with strict return precautions.  She states understanding and agreement with plan for discharge at this time.   Final Clinical Impression(s) / ED Diagnoses Final diagnoses:  Injury of finger of left hand, initial encounter    Rx / DC Orders ED Discharge Orders     None         Rhys Martini, DO 09/19/22 0030    Charlynne Pander, MD 09/24/22 1500

## 2022-09-18 NOTE — Discharge Instructions (Signed)
Alternate Motrin and Tylenol as needed for pain.  Elevate and ice as needed for pain.  Keep area clean and dry for 24 hours. After than, Let soap and water can run over but do not soak or scrub it.  Follow-up with hand surgery within the next 1 week.  Information is attached above.  Monitor for any signs worsen including redness, drainage, swelling at a proportion, and fevers.  Return to the ED for any worsening symptoms or further concerns.

## 2022-09-30 ENCOUNTER — Other Ambulatory Visit: Payer: Self-pay

## 2022-09-30 MED ORDER — ELINEST 0.3-30 MG-MCG PO TABS
1.0000 | ORAL_TABLET | Freq: Every day | ORAL | 0 refills | Status: DC
  Filled 2022-09-30: qty 84, 84d supply, fill #0

## 2022-10-01 ENCOUNTER — Other Ambulatory Visit: Payer: Self-pay

## 2022-10-22 ENCOUNTER — Other Ambulatory Visit: Payer: Self-pay

## 2022-10-22 DIAGNOSIS — Z01419 Encounter for gynecological examination (general) (routine) without abnormal findings: Secondary | ICD-10-CM | POA: Diagnosis not present

## 2022-10-22 DIAGNOSIS — Z113 Encounter for screening for infections with a predominantly sexual mode of transmission: Secondary | ICD-10-CM | POA: Diagnosis not present

## 2022-10-22 DIAGNOSIS — Z3040 Encounter for surveillance of contraceptives, unspecified: Secondary | ICD-10-CM | POA: Diagnosis not present

## 2022-10-22 DIAGNOSIS — Z6825 Body mass index (BMI) 25.0-25.9, adult: Secondary | ICD-10-CM | POA: Diagnosis not present

## 2022-10-22 MED ORDER — ELINEST 0.3-30 MG-MCG PO TABS
1.0000 | ORAL_TABLET | Freq: Every day | ORAL | 3 refills | Status: DC
  Filled 2022-12-21: qty 84, 84d supply, fill #0
  Filled 2023-03-15: qty 84, 84d supply, fill #1
  Filled 2023-06-07: qty 84, 84d supply, fill #2
  Filled 2023-08-30: qty 84, 84d supply, fill #3

## 2022-10-27 LAB — HM PAP SMEAR: HPV, high-risk: NEGATIVE

## 2022-11-12 ENCOUNTER — Encounter: Payer: Self-pay | Admitting: Internal Medicine

## 2022-11-12 ENCOUNTER — Ambulatory Visit: Payer: 59 | Admitting: Internal Medicine

## 2022-11-12 ENCOUNTER — Other Ambulatory Visit: Payer: Self-pay

## 2022-11-12 VITALS — BP 122/74 | HR 78 | Temp 98.0°F | Ht 66.0 in | Wt 159.6 lb

## 2022-11-12 DIAGNOSIS — L03011 Cellulitis of right finger: Secondary | ICD-10-CM

## 2022-11-12 MED ORDER — DOXYCYCLINE HYCLATE 100 MG PO TABS: 100.0000 mg | ORAL_TABLET | Freq: Two times a day (BID) | ORAL | 0 refills | Status: DC

## 2022-11-12 MED ORDER — DOXYCYCLINE HYCLATE 100 MG PO CAPS
100.0000 mg | ORAL_CAPSULE | Freq: Two times a day (BID) | ORAL | 0 refills | Status: DC
Start: 2022-11-12 — End: 2023-02-10
  Filled 2022-11-12: qty 14, 7d supply, fill #0

## 2022-11-12 NOTE — Progress Notes (Signed)
[facility]: [phone]   -- Medical Office Visit --  Patient:  Alexis Norton      Age: 22 y.o.       Sex:  female  Date:   11/12/2022 Patient Care Team: Alexis Olszewski, MD as PCP - General (Internal Medicine) Alexis Sprague, MD (Inactive) as PCP - Cardiology (Cardiology) Norton, Alexis Sat, MD as Alexis [facility] (Student) Today's Healthcare Provider: Lula Olszewski, MD   Assessment & Plan Paronychia of right index finger      Paronychia of the right index finger Noted swelling and redness of her right index finger, without drainage or pus, likely stems from the removal of a hangnail. 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 We will prescribe Doxycycline, instructing her to take it with at least 8 ounces of fluid to prevent esophagitis. She is advised to monitor for improvement and complete the full course of antibiotics. Should symptoms resolve before starting antibiotics, she should save the medication for potential future use. Additionally, we advised on proper nail care to prevent recurrence, including avoiding picking at nails, using clean nail scissors, and not pushing back cuticles.      Diagnoses and all orders for this visit: Paronychia of right index finger -     doxycycline (VIBRAMYCIN) 100 MG capsule; Take 1 capsule (100 mg total) by mouth 2 (two) times daily. -     doxycycline (VIBRA-TABS) 100 MG tablet; Take 1 tablet (100 mg total) by mouth 2 (two) times daily.  Recommended follow-up: No follow-ups on file. Future Appointments  Date Time Provider Department Center  02/10/2023  9:30 AM Alexis Piedra, DO [facility] None  09/03/2023 10:00 AM Alexis Olszewski, MD [facility]        Subjective   22 y.o. 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 Her reasons/main concerns/chief complaints for today's office visit are Nail Problem (Pt c/o of right pointer finger swelling and tightness, has noticed it since yesterday. )   ------------------------------------------------------------------------------------------------------------------------ AI-Extracted: Discussed the use of AI scribe software for clinical note transcription with the patient, who gave verbal consent to proceed.  History of Present Illness   The patient, a Research scientist (physical sciences), presented with a complaint of a swollen and red right index finger. The issue began after the patient pulled a hangnail on the affected finger. Initially, the patient did not experience any significant discomfort, but the following day, the finger was notably swollen and red. The patient reported that the swelling has since decreased and the initial open wound appears to have closed, reducing the associated pain. However, the finger remains swollen. The patient denied any drainage from the site.  The patient has a history of a similar issue where a nail was ripped off at work, leading to significant pain and a month of light duty. This incident has led to a heightened awareness and concern about nail and finger injuries. The patient maintains well-kept nails, usually managed by a professional, and does not frequently experience hangnails.  The patient works in a healthcare environment, increasing the risk of exposure to infectious agents such as MRSA. The patient's colleagues suggested that the current finger issue could be a MRSA infection. The patient is aware of the need for antibiotics and the potential for a more serious infection if not properly treated.  The patient is due to start work later in the day and expressed concern about the potential for the condition to be contagious. The patient plans to cover the finger at work to prevent further exposure. The patient is also  scheduled to go out on Friday and wanted to ensure the condition was addressed before then.  The patient has a known allergy to Bactrim, cephalosporins, sulfa antibiotics, and penicillin. The patient is aware of the need to complete the full course of antibiotics once started to prevent the development of antibiotic-resistant bacteria. The patient plans to call the pharmacy to confirm the prescription and price of the prescribed doxycycline.   She has a past medical history of Allergy, Chest tightness, COVID, Febrile seizures (HCC), Fever, Headache, Leg pain, Migraine (09/02/2022), Palpitations, Penicillin allergy (08/30/2021), Pharyngitis, Respiratory infection, Seizures (HCC), Sore throat, and Tachycardia.  Problem list overviews that were updated at today's visit: No problems updated. Current Outpatient Medications on File Prior to Visit  Medication Sig   ascorbic acid (VITAMIN C) 500 MG tablet Take 500 mg by mouth daily.   Cholecalciferol (VITAMIN D3) 25 MCG (1000 UT) CAPS daily.   ELDERBERRY PO Take 1 tablet by mouth daily.   fexofenadine (ALLEGRA ALLERGY) 180 MG tablet Take 1 tablet every day by oral route.   fluticasone (FLONASE) 50 MCG/ACT nasal spray Place 1 spray into both nostrils daily.   Magnesium 200 MG TABS 2 tablet orally once daily   norgestrel-ethinyl estradiol (ELINEST) 0.3-30 MG-MCG tablet Take 1 tablet by mouth daily.   Probiotic Product (PROBIOTIC PO) Take 1 capsule by mouth daily.   ciprofloxacin (CIPRO) 500 MG tablet Take 1 tablet every 12 hours by oral route for 7 days. (Patient not taking: Reported on 11/12/2022)   Vitamin D, Cholecalciferol, 25 MCG (1000 UT) TABS Take 1 tablet by mouth daily.   No current facility-administered medications on file prior to visit.  There are no discontinued medications.   Objective   Physical Exam  BP 122/74   Pulse 78   Temp 98 F (36.7 C)   Ht 5\' 6"  (1.676 m)   Wt 159 lb 9.6 oz (72.4 kg)   SpO2 98%   BMI 25.76 kg/m  Wt  Readings from Last 10 Encounters:  11/12/22 159 lb 9.6 oz (72.4 kg)  09/18/22 158 lb 11.7 oz (72 kg)  09/02/22 159 lb (72.1 kg)  08/30/21 160 lb 3.2 oz (72.7 kg)  04/20/20 154 lb (69.9 kg)  03/27/20 157 lb (71.2 kg)  02/07/20 153 lb 3.2 oz (69.5 kg)  02/03/20 156 lb 12.8 oz (71.1 kg)  12/30/19 148 lb (67.1 kg)   Vital signs reviewed.  Nursing notes reviewed. Weight trend reviewed. Abnormalities and Problem-Specific physical exam findings:   General Appearance:  No acute distress appreciable.   Well-groomed, healthy-appearing female.  Well proportioned with no abnormal fat distribution.  Good muscle tone. Pulmonary:  Normal work of breathing at rest, no respiratory distress apparent. SpO2: 98 %  Musculoskeletal: All extremities are intact.  Neurological:  Awake, alert, oriented, and engaged.  No obvious focal neurological deficits or cognitive impairments.  Sensorium seems unclouded.   Speech is clear and coherent with logical content. Psychiatric:  Appropriate mood, pleasant and cooperative demeanor, thoughtful and engaged during the exam  Results           No results found for any visits on 11/12/22.  Scanned Document on 10/28/2022  Component Date Value   HM Pap smear 10/27/2022 NILM    HPV,  high-risk 10/27/2022 Negative   Office Visit on 09/02/2022  Component Date Value   Cholesterol 09/02/2022 192    Triglycerides 09/02/2022 114.0    HDL 09/02/2022 63.50    VLDL 09/02/2022 22.8    LDL Cholesterol 09/02/2022 105 (H)    Total CHOL/HDL Ratio 09/02/2022 3    NonHDL 09/02/2022 128.26    TSH 09/02/2022 2.14    Sodium 09/02/2022 137    Potassium 09/02/2022 4.3    Chloride 09/02/2022 102    CO2 09/02/2022 24    Glucose, Bld 09/02/2022 84    BUN 09/02/2022 11    Creatinine, Ser 09/02/2022 0.86    Total Bilirubin 09/02/2022 0.6    Alkaline Phosphatase 09/02/2022 39    AST 09/02/2022 15    ALT 09/02/2022 12    Total Protein 09/02/2022 8.3    Albumin 09/02/2022 4.9    GFR  09/02/2022 95.23    Calcium 09/02/2022 10.1    WBC 09/02/2022 8.4    RBC 09/02/2022 4.56    Hemoglobin 09/02/2022 13.6    HCT 09/02/2022 40.5    MCV 09/02/2022 88.8    MCHC 09/02/2022 33.7    RDW 09/02/2022 12.5    Platelets 09/02/2022 397.0    Neutrophils Relative % 09/02/2022 54.3    Lymphocytes Relative 09/02/2022 37.9    Monocytes Relative 09/02/2022 4.9    Eosinophils Relative 09/02/2022 2.2    Basophils Relative 09/02/2022 0.7    Neutro Abs 09/02/2022 4.6    Lymphs Abs 09/02/2022 3.2    Monocytes Absolute 09/02/2022 0.4    Eosinophils Absolute 09/02/2022 0.2    Basophils Absolute 09/02/2022 0.1    No image results found.   DG Finger Middle Left  Result Date: 09/18/2022 CLINICAL DATA:  Pain, injury EXAM: LEFT MIDDLE FINGER 2+V COMPARISON:  None Available. FINDINGS: Frontal, oblique, and lateral views of the left third digit are obtained. No acute fracture, subluxation, or dislocation. Joint spaces are well preserved. Soft tissues are unremarkable. IMPRESSION: 1. Unremarkable left third digit. Electronically Signed   By: Sharlet Salina M.D.   On: 09/18/2022 16:24    DG Finger Middle Left  Result Date: 09/18/2022 CLINICAL DATA:  Pain, injury EXAM: LEFT MIDDLE FINGER 2+V COMPARISON:  None Available. FINDINGS: Frontal, oblique, and lateral views of the left third digit are obtained. No acute fracture, subluxation, or dislocation. Joint spaces are well preserved. Soft tissues are unremarkable. IMPRESSION: 1. Unremarkable left third digit. Electronically Signed   By: Sharlet Salina M.D.   On: 09/18/2022 16:24     Additional Info: This encounter employed real-time, collaborative documentation. The patient actively reviewed and updated their medical record on a shared screen, ensuring transparency and facilitating joint problem-solving for the problem list, overview, and plan. This approach promotes accurate, informed care. The treatment plan was discussed and reviewed in detail, including  medication safety, potential side effects, and all patient questions. We confirmed understanding and comfort with the plan. Follow-up instructions were established, including contacting the office for any concerns, returning if symptoms worsen, persist, or new symptoms develop, and precautions for potential emergency department visits.

## 2022-11-12 NOTE — Patient Instructions (Addendum)
VISIT SUMMARY:  During your visit, we discussed your concern about the swelling and redness in your right index finger, which started after you pulled a hangnail. You mentioned that the swelling has decreased and the initial wound seems to have closed, but the finger remains swollen. 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 You also expressed concern about the condition being contagious and affecting your work and social plans.  YOUR PLAN:  -SWOLLEN AND RED RIGHT INDEX FINGER: This condition, known as paronychia, is an infection of the skin around the nail. We have prescribed Doxycycline, an antibiotic, to treat the infection. Please take this medication with at least 8 ounces of fluid to prevent irritation of the esophagus. It's important to complete the full course of antibiotics, even if symptoms improve, to ensure the infection is fully treated. If symptoms resolve before you start the antibiotics, you can save the medication for potential future use.  -PREVENTION OF RECURRENCE: To prevent this condition from recurring, it's important to take care of your nails properly. This includes avoiding picking at your nails, using clean nail scissors, and not pushing back your cuticles.  INSTRUCTIONS:  Monitor your finger for improvement. If the swelling and redness do not improve or if they worsen, please contact us immediately. Remember to call your pharmacy to confirm the prescription and price of the prescribed Doxycycline.    # Preventing Recurrence of Paronychia  Paronychia is an infection of the skin around your fingernails or toenails. This guide will help you prevent future occurrences.  ## Understanding Paronychia  Paronychia often starts with a hangnail or injury to the skin around your nail. It can be acute (short-term) or chronic (long-term).  ## Prevention Tips  1. **Proper Nail Care**    - Trim nails straight across and round  the edges gently with a file    - Don't cut nails too short or dig into the corners    - Use clean, sharp nail clippers or scissors  2. **Avoid Trauma to Nails**    - Don't bite your nails or pick at the skin around them    - Avoid pushing back or trimming your cuticles    - Use tools properly to avoid injuring the nail area  3. **Keep Hands Dry**    - Wear waterproof gloves when washing dishes or cleaning    - Dry hands thoroughly after washing    - Use hand cream to prevent skin from becoming too dry and cracking  4. **Manage Hangnails Properly**    - Don't pull on hangnails; clip them carefully with clean scissors    - Apply an antibiotic ointment after trimming a hangnail  5. **Hygiene Practices**    - Wash hands regularly with soap and water    - Use hand sanitizer when soap and water aren't available    - Keep nails clean by gently scrubbing under them with a soft brush  6. **Avoid Nail Products (if prone to chronic paronychia)**    - Limit use of nail polish and artificial nails    - If using these products, give your nails regular breaks  7. **Protect Your Hands**    - Wear gloves when doing manual work or using chemicals    - Be cautious when handling sharp objects  8. **Manage Underlying Conditions**    - Control diabetes if you have it, as high blood sugar can increase infection risk    -  Treat any skin conditions like eczema that may affect your hands  9. **Healthy Diet and Lifestyle**    - Eat a balanced diet rich in vitamins and minerals for nail health    - Stay hydrated to keep skin supple    - Quit smoking, as it can impair circulation and healing  ## When to Seek Medical Attention  Contact your healthcare provider if you notice: - Redness, swelling, or pus around the nail - Discoloration or change in nail shape - Pain that doesn't improve or worsens - Fever or other signs of spreading infection  Remember, prevention is key. By following these  guidelines, you can significantly reduce your risk of developing paronychia again.

## 2022-11-13 ENCOUNTER — Other Ambulatory Visit: Payer: Self-pay

## 2022-12-22 ENCOUNTER — Other Ambulatory Visit: Payer: Self-pay

## 2023-02-10 ENCOUNTER — Encounter: Payer: Self-pay | Admitting: Dermatology

## 2023-02-10 ENCOUNTER — Ambulatory Visit: Payer: 59 | Admitting: Dermatology

## 2023-02-10 VITALS — BP 140/88 | HR 89

## 2023-02-10 DIAGNOSIS — D492 Neoplasm of unspecified behavior of bone, soft tissue, and skin: Secondary | ICD-10-CM

## 2023-02-10 DIAGNOSIS — D485 Neoplasm of uncertain behavior of skin: Secondary | ICD-10-CM

## 2023-02-10 DIAGNOSIS — D2271 Melanocytic nevi of right lower limb, including hip: Secondary | ICD-10-CM | POA: Diagnosis not present

## 2023-02-10 DIAGNOSIS — L578 Other skin changes due to chronic exposure to nonionizing radiation: Secondary | ICD-10-CM | POA: Diagnosis not present

## 2023-02-10 DIAGNOSIS — Z1283 Encounter for screening for malignant neoplasm of skin: Secondary | ICD-10-CM | POA: Diagnosis not present

## 2023-02-10 DIAGNOSIS — L821 Other seborrheic keratosis: Secondary | ICD-10-CM | POA: Diagnosis not present

## 2023-02-10 DIAGNOSIS — D1801 Hemangioma of skin and subcutaneous tissue: Secondary | ICD-10-CM

## 2023-02-10 DIAGNOSIS — W908XXA Exposure to other nonionizing radiation, initial encounter: Secondary | ICD-10-CM | POA: Diagnosis not present

## 2023-02-10 DIAGNOSIS — L814 Other melanin hyperpigmentation: Secondary | ICD-10-CM | POA: Diagnosis not present

## 2023-02-10 DIAGNOSIS — D229 Melanocytic nevi, unspecified: Secondary | ICD-10-CM

## 2023-02-10 DIAGNOSIS — Z808 Family history of malignant neoplasm of other organs or systems: Secondary | ICD-10-CM

## 2023-02-10 DIAGNOSIS — L509 Urticaria, unspecified: Secondary | ICD-10-CM

## 2023-02-10 DIAGNOSIS — D225 Melanocytic nevi of trunk: Secondary | ICD-10-CM

## 2023-02-10 NOTE — Patient Instructions (Addendum)

## 2023-02-10 NOTE — Progress Notes (Signed)
 New Patient Visit   Subjective  Alexis Norton is a 23 y.o. female who presents for the following:  Total Body Skin Exam (TBSE)  Patient present today for new patient visit for TBSE.The patient denies she has spots, moles and lesions to be evaluated, some may be new or changing and the patient may have concern these could be cancer. Patient has previously been treated by a dermatologist.Patient reports she does not have hx of bx. Patient reports family history of skin cancers (Mom and Grandad both dx with Melanoma). Patient reports throughout her lifetime has had moderate sun exposure. Currently, patient reports if she has excessive sun exposure, she does apply sunscreen and/or wears protective coverings.  The following portions of the chart were reviewed this encounter and updated as appropriate: medications, allergies, medical history  Review of Systems:  No other skin or systemic complaints except as noted in HPI or Assessment and Plan.  Objective  Well appearing patient in no apparent distress; mood and affect are within normal limits.  A full examination was performed including scalp, head, eyes, ears, nose, lips, neck, chest, axillae, abdomen, back, buttocks, bilateral upper extremities, bilateral lower extremities, hands, feet, fingers, toes, fingernails, and toenails. All findings within normal limits unless otherwise noted below.   Relevant exam findings are noted in the Assessment and Plan.            Right Gluteal Crease 5 mm x 3 mm irregular brown papule  Right Plantar Surface of Heel 4 mm irregular Brown Macule  Assessment & Plan   LENTIGINES, HEMANGIOMAS - Benign normal skin lesions - Benign-appearing - Call for any changes  MELANOCYTIC & Epidermal NEVI - Tan-brown and/or pink-flesh-colored symmetric macules and papules - Benign appearing on exam today - Observation - Call clinic for new or changing moles - Recommend daily use of broad spectrum spf 30+  sunscreen to sun-exposed areas.   URTICARIA Exam: edematous pink papules and plaques +/- dermatographism  Flared  Urticaria or hives is a pink to red patchy whelp- like rash of the skin that typically itches and it is the result of histamine release in the skin.   Hives may have multiple causes including stress, medications, infections, and systemic illness.  Sometimes there is a family history of chronic urticaria.   Physical urticarias may be caused by pressure (dermatographism), heat, sun, cold, vibration.  Insect bites can cause papular urticaria. It is often difficult to find the cause of generalized hives.  Statistically, 70% of the time a cause of generalized hives is not found.  Sometimes hives can spontaneously resolve. Other times hives can persist and when it does, and no cause is found, and it has been at least 6 weeks since started, it is called chronic idiopathic urticaria. Antihistamines are the mainstay for treatment.  In severe cases Xolair injections may be used.   ACTINIC DAMAGE - Chronic condition, secondary to cumulative UV/sun exposure - diffuse scaly erythematous macules with underlying dyspigmentation - Recommend daily broad spectrum sunscreen SPF 30+ to sun-exposed areas, reapply every 2 hours as needed.  - Staying in the shade or wearing long sleeves, sun glasses (UVA+UVB protection) and wide brim hats (4-inch brim around the entire circumference of the hat) are also recommended for sun protection.  - Call for new or changing lesions.  SKIN CANCER SCREENING PERFORMED TODAY NEOPLASM OF UNCERTAIN BEHAVIOR OF SKIN (2) Right Gluteal Crease Skin / nail biopsy Type of biopsy: tangential   Informed consent: discussed and consent obtained  Timeout: patient name, date of birth, surgical site, and procedure verified   Procedure prep:  Patient was prepped and draped in usual sterile fashion Prep type:  Isopropyl alcohol Anesthesia: the lesion was anesthetized in a  standard fashion   Anesthetic:  1% lidocaine  w/ epinephrine 1-100,000 buffered w/ 8.4% NaHCO3 Instrument used: DermaBlade   Hemostasis achieved with: aluminum chloride   Outcome: patient tolerated procedure well   Post-procedure details: sterile dressing applied and wound care instructions given   Dressing type: petrolatum gauze and bandage   Specimen A - Surgical pathology Differential Diagnosis: DN  Check Margins: No Right Plantar Surface of Heel Skin / nail biopsy Type of biopsy: tangential   Informed consent: discussed and consent obtained   Timeout: patient name, date of birth, surgical site, and procedure verified   Procedure prep:  Patient was prepped and draped in usual sterile fashion Prep type:  Isopropyl alcohol Anesthesia: the lesion was anesthetized in a standard fashion   Anesthetic:  1% lidocaine  w/ epinephrine 1-100,000 buffered w/ 8.4% NaHCO3 Instrument used: DermaBlade   Hemostasis achieved with: aluminum chloride   Outcome: patient tolerated procedure well   Post-procedure details: sterile dressing applied and wound care instructions given   Dressing type: petrolatum gauze and bandage   Specimen B - Surgical pathology Differential Diagnosis: DN  Check Margins: No SKIN EXAM FOR MALIGNANT NEOPLASM   MULTIPLE BENIGN MELANOCYTIC NEVI   CHERRY ANGIOMA   LENTIGINES   SEBORRHEIC KERATOSIS   ACTINIC SKIN DAMAGE    Return in about 1 year (around 02/10/2024) for TBSE.   Documentation: I have reviewed the above documentation for accuracy and completeness, and I agree with the above.  I, Jetta Ager, am acting as scribe for Delon Lenis, DO.  Delon Lenis, DO

## 2023-02-12 LAB — SURGICAL PATHOLOGY

## 2023-02-18 ENCOUNTER — Encounter: Payer: Self-pay | Admitting: Dermatology

## 2023-02-18 NOTE — Progress Notes (Signed)
 Hi Shirron,  Please call pt and notify that their bx results showed an abnormal mole that requires a full excision in office with Dr Corey  Diagnosis 1. Skin , right gluteal crease --> Monitor at yearly skin checks DYSPLASTIC COMPOUND NEVUS WITH MODERATE ATYPIA, DEEP MARGIN INVOLVED  2. Skin , right plantar surface of heel --> SE with Dr Corey ACHILLES NEVUS WITH MODERATE TO SEVERE ATYPIA, PERIPHERAL MARGIN INVOLVED, SEE DESCRIPTION

## 2023-02-24 ENCOUNTER — Telehealth: Payer: Self-pay | Admitting: Dermatology

## 2023-02-24 NOTE — Telephone Encounter (Signed)
Called pt and went over the results of her biopsy and what to expect the day of surgery.  She was ok for me to send it to scheduling.

## 2023-03-16 ENCOUNTER — Encounter: Payer: Self-pay | Admitting: Dermatology

## 2023-03-17 ENCOUNTER — Other Ambulatory Visit: Payer: Self-pay

## 2023-03-18 ENCOUNTER — Encounter: Payer: Self-pay | Admitting: Dermatology

## 2023-03-18 ENCOUNTER — Ambulatory Visit (INDEPENDENT_AMBULATORY_CARE_PROVIDER_SITE_OTHER): Payer: 59 | Admitting: Dermatology

## 2023-03-18 VITALS — BP 122/89 | HR 71 | Temp 98.1°F

## 2023-03-18 DIAGNOSIS — L988 Other specified disorders of the skin and subcutaneous tissue: Secondary | ICD-10-CM | POA: Diagnosis not present

## 2023-03-18 DIAGNOSIS — D2371 Other benign neoplasm of skin of right lower limb, including hip: Secondary | ICD-10-CM

## 2023-03-18 DIAGNOSIS — D239 Other benign neoplasm of skin, unspecified: Secondary | ICD-10-CM

## 2023-03-18 MED ORDER — MUPIROCIN 2 % EX OINT: 1.0000 | TOPICAL_OINTMENT | Freq: Two times a day (BID) | CUTANEOUS | 1 refills | Status: AC

## 2023-03-18 NOTE — Progress Notes (Signed)
   Follow-Up Visit   Subjective  Alexis Norton is a 23 y.o. 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 Onalee Hua.  The following portions of the chart were reviewed this encounter and updated as appropriate: medications, allergies, medical history  Review of Systems:  No other skin or systemic complaints except as noted in HPI or Assessment and Plan.  Objective  Well appearing patient in no apparent distress; mood and affect are within normal limits.  A focused examination was performed of the following areas:  Right foot  Relevant physical exam findings are noted in the Assessment and Plan.   Right Plantar Surface of Heel Tan-brown and/or pink-flesh-colored symmetric macules and papules.    Assessment & Plan   DYSPLASTIC NEVUS Right Plantar Surface of Heel Skin excision - Right Plantar Surface of Heel  Excision method:  elliptical Lesion length (cm):  1.1 Lesion width (cm):  0.8 Margin per side (cm):  0.5 Total excision diameter (cm):  2.1 Informed consent: discussed and consent obtained   Timeout: patient name, date of birth, surgical site, and procedure verified   Procedure prep:  Patient was prepped and draped in usual sterile fashion Prep type:  Chlorhexidine Anesthesia: the lesion was anesthetized in a standard fashion   Anesthetic:  1% lidocaine w/ epinephrine 1-100,000 buffered w/ 8.4% NaHCO3 Instrument used: #15 blade   Hemostasis achieved with: suture, pressure, Gelfoam and electrodesiccation   Outcome: patient tolerated procedure well with no complications   Post-procedure details: sterile dressing applied and wound care instructions given   Dressing type: petrolatum, bandage and pressure dressing   Specimen 1 - Surgical pathology Differential Diagnosis: DN  Previous biopsy# [id number]  Check Margins: Yes  Return if symptoms worsen or fail to improve.  Dominga Ferry, Surg Tech III,  am acting as scribe for Gwenith Daily, MD.   Documentation: I have reviewed the above documentation for accuracy and completeness, and I agree with the above.  Gwenith Daily, MD

## 2023-03-18 NOTE — Patient Instructions (Signed)
 Important Information  Due to recent changes in healthcare laws, you may see results of your pathology and/or laboratory studies on MyChart before the doctors have had a chance to review them. We understand that in some cases there may be results that are confusing or concerning to you. Please understand that not all results are received at the same time and often the doctors may need to interpret multiple results in order to provide you with the best plan of care or course of treatment. Therefore, we ask that you please give [place] 2 business days to thoroughly review all your results before contacting the office for clarification. Should we see a critical lab result, you will be contacted sooner.   If You Need Anything After Your Visit  If you have any questions or concerns for your doctor, please call our main line at [phone] If no one answers, please leave a voicemail as directed and we will return your call as soon as possible. Messages left after 4 pm will be answered the following business day.   You may also send [place] a message via MyChart. We typically respond to MyChart messages within 1-2 business days.  For prescription refills, please ask your pharmacy to contact our office. Our fax number is [phone].  If you have an urgent issue when the clinic is closed that cannot wait until the next business day, you can page your doctor at the number below.    Please note that while we do our best to be available for urgent issues outside of office hours, we are not available 24/7.   If you have an urgent issue and are unable to reach [place], you may choose to seek medical care at your doctor's office, retail clinic, urgent care center, or emergency room.  If you have a medical emergency, please immediately call 911 or go to the emergency department. In the event of inclement weather, please call our main line at [phone] for an update on the status of any delays or  closures.  Dermatology Medication Tips: Please keep the boxes that topical medications come in in order to help keep track of the instructions about where and how to use these. Pharmacies typically print the medication instructions only on the boxes and not directly on the medication tubes.   If your medication is too expensive, please contact our office at [phone] or send [place] a message through MyChart.   We are unable to tell what your co-pay for medications will be in advance as this is different depending on your insurance coverage. However, we may be able to find a substitute medication at lower cost or fill out paperwork to get insurance to cover a needed medication.   If a prior authorization is required to get your medication covered by your insurance company, please allow [place] 1-2 business days to complete this process.  Drug prices often vary depending on where the prescription is filled and some pharmacies may offer cheaper prices.  The website www.goodrx.com contains coupons for medications through different pharmacies. The prices here do not account for what the cost may be with help from insurance (it may be cheaper with your insurance), but the website can give you the price if you did not use any insurance.  - You can print the associated coupon and take it with your prescription to the pharmacy.  - You may also stop by our office during regular business hours and pick up a GoodRx coupon card.  - If  you need your prescription sent electronically to a different pharmacy, notify our office through [facility] or by phone at [phone]    Wound Care Instructions for After Surgery  On the day following your surgery, you should begin doing daily dressing changes until your sutures are removed: Remove the bandage. Cleanse the wound gently with soap and water.  Make sure you then dry the skin surrounding the wound completely or the tape will not stick to the skin. Do not  use cotton balls on the wound. After the wound is clean and dry, apply the ointment (either prescription antibiotic prescribed by your doctor or plain Vaseline if nothing was prescribed) gently with a Q-tip. If you are using a bandaid to cover: Apply a bandaid large enough to cover the entire wound. If you do not have a bandaid large enough to cover the wound OR if you are sensitive to bandaid adhesive: Cut a non-stick pad (such as Telfa) to fit the size of the wound.  Cover the wound with the non-stick pad. If the wound is draining, you may want to add a small amount of gauze on top of the non-stick pad for a little added compression to the area. Use tape to seal the area completely.  For the next 1-2 weeks: Be sure to keep the wound moist with ointment 24/7 to ensure best healing. If you are unable to cover the wound with a bandage to hold the ointment in place, you may need to reapply the ointment several times a day. Do not bend over or lift heavy items to reduce the chance of elevated blood pressure to the wound. Do not participate in particularly strenuous activities.  Below is a list of dressing supplies you might need.  Cotton-tipped applicators - Q-tips Gauze pads (2x2 and/or 4x4) - All-Purpose Sponges New and clean tube of petroleum jelly (Vaseline) OR prescription antibiotic ointment if prescribed Either a bandaid large enough to cover the entire wound OR non-stick dressing material (Telfa) and Tape (Paper or Hypafix)  FOR ADULT SURGERY PATIENTS: If you need something for pain relief, you may take 1 extra strength Tylenol (acetaminophen) and 2 ibuprofen (200 mg) together every 4 hours as needed. (Do not take these medications if you are allergic to them or if you know you cannot take them for any other reason). Typically you may only need pain medication for 1-3 days.   Comments on the Post-Operative Period Slight swelling and redness often appear around the wound. This is normal  and will disappear within several days following the surgery. The healing wound will drain a brownish-red-yellow discharge during healing. This is a normal phase of wound healing. As the wound begins to heal, the drainage may increase in amount. Again, this drainage is normal. Notify us if the drainage becomes persistently bloody, excessively swollen, or intensely painful or develops a foul odor or red streaks.  The healing wound will also typically be itchy. This is normal. If you have severe or persistent pain, Notify us if the discomfort is severe or persistent. Avoid alcoholic beverages when taking pain medicine.  In Case of Wound Hemorrhage A wound hemorrhage is when the bandage suddenly becomes soaked with bright red blood and flows profusely. If this happens, sit down or lie down with your head elevated. If the wound has a dressing on it, do not remove the dressing. Apply pressure to the existing gauze. If the wound is not covered, use a gauze pad to apply pressure and  continue applying the pressure for 20 minutes without peeking. DO NOT COVER THE WOUND WITH A LARGE TOWEL OR WASH CLOTH. Release your hand from the wound site but do not remove the dressing. If the bleeding has stopped, gently clean around the wound. Leave the dressing in place for 24 hours if possible. This wait time allows the blood vessels to close off so that you do not spark a new round of bleeding by disrupting the newly clotted blood vessels with an immediate dressing change. If the bleeding does not subside, continue to hold pressure for 40 minutes. If bleeding continues, page your physician, contact an After Hours clinic or go to the Emergency Room.

## 2023-03-19 ENCOUNTER — Ambulatory Visit: Payer: 59 | Admitting: Dermatology

## 2023-03-19 LAB — SURGICAL PATHOLOGY

## 2023-04-15 ENCOUNTER — Encounter: Payer: Self-pay | Admitting: Dermatology

## 2023-04-15 ENCOUNTER — Ambulatory Visit (INDEPENDENT_AMBULATORY_CARE_PROVIDER_SITE_OTHER): Payer: 59 | Admitting: Dermatology

## 2023-04-15 DIAGNOSIS — Z86018 Personal history of other benign neoplasm: Secondary | ICD-10-CM | POA: Diagnosis not present

## 2023-04-15 DIAGNOSIS — D239 Other benign neoplasm of skin, unspecified: Secondary | ICD-10-CM

## 2023-04-15 DIAGNOSIS — L539 Erythematous condition, unspecified: Secondary | ICD-10-CM

## 2023-04-15 DIAGNOSIS — T1490XD Injury, unspecified, subsequent encounter: Secondary | ICD-10-CM

## 2023-04-15 MED ORDER — MUPIROCIN 2 % EX OINT: 1.0000 | TOPICAL_OINTMENT | Freq: Two times a day (BID) | CUTANEOUS | 3 refills | Status: DC

## 2023-04-15 NOTE — Progress Notes (Addendum)
   Follow Up Visit   Subjective  Alexis Norton is a 23 y.o. female who presents for the following: follow up from excision follow up  The patient presents for follow up from excision for a DN on the right plantar surface of heel, treated on 03/18/23, repaired with second intention. The patient has been bandaging the wound as directed. The endorse the following concerns: none  The following portions of the chart were reviewed this encounter and updated as appropriate: medications, allergies, medical history  Review of Systems:  No other skin or systemic complaints except as noted in HPI or Assessment and Plan.  Objective  Well appearing patient in no apparent distress; mood and affect are within normal limits.  A full examination was performed including scalp, head, face and right plantar surface of heel. All findings within normal limits unless otherwise noted below.  Healing wound with mild erythema  Relevant physical exam findings are noted in the Assessment and Plan.      Assessment & Plan   Healing s/p excision for DN, treated on 03/18/23, repaired with second intention - Reassured that wound is healing well - No evidence of infection - No swelling, induration, purulence, dehiscence, or tenderness out of proportion to the clinical exam, see photo above - Discussed that scars take up to 12 months to mature from the date of surgery - Recommend SPF 30+ to scar daily to prevent purple color from UV exposure during scar maturation process - Discussed that erythema and raised appearance of scar will fade over the next 4-6 months - OK to start scar massage at 4-6 weeks post-op - Can consider silicone based products for scar healing starting at 6 weeks post-op - Ok to continue ointment daily to wound under a bandage for another 2-3 weeks  History of Dysplastic Nevi - No evidence of recurrence today - Recommend regular full body skin exams - Recommend daily broad spectrum  sunscreen SPF 30+ to sun-exposed areas, reapply every 2 hours as needed.  - Call if any new or changing lesions are noted between office visits  Return in about 4 weeks (around 05/13/2023) for wound check.  I, Wilson Hasten, CMA, am acting as scribe for Deneise Finlay, MD.   Documentation: I have reviewed the above documentation for accuracy and completeness, and I agree with the above.  Deneise Finlay, MD

## 2023-04-15 NOTE — Patient Instructions (Signed)

## 2023-05-11 ENCOUNTER — Encounter: Payer: Self-pay | Admitting: Dermatology

## 2023-05-11 ENCOUNTER — Ambulatory Visit (INDEPENDENT_AMBULATORY_CARE_PROVIDER_SITE_OTHER): Admitting: Dermatology

## 2023-05-11 DIAGNOSIS — T1490XD Injury, unspecified, subsequent encounter: Secondary | ICD-10-CM

## 2023-05-11 DIAGNOSIS — Z86018 Personal history of other benign neoplasm: Secondary | ICD-10-CM | POA: Diagnosis not present

## 2023-05-11 DIAGNOSIS — L539 Erythematous condition, unspecified: Secondary | ICD-10-CM | POA: Diagnosis not present

## 2023-05-11 DIAGNOSIS — D239 Other benign neoplasm of skin, unspecified: Secondary | ICD-10-CM

## 2023-05-11 NOTE — Progress Notes (Signed)
   Follow Up Visit   Subjective  Alexis Norton is a 23 y.o. female who presents for the following: follow up from excision on her right heel  The patient presents for follow up from excision for a DN on the right plantar surface of heel, treated on 03/18/23, repaired with 2nd intention. The patient has been bandaging the wound as directed. The endorse the following concerns: none  The following portions of the chart were reviewed this encounter and updated as appropriate: medications, allergies, medical history  Review of Systems:  No other skin or systemic complaints except as noted in HPI or Assessment and Plan.  Objective  Well appearing patient in no apparent distress; mood and affect are within normal limits.  A full examination was performed including scalp, head, face and right plantar surface of heel. All findings within normal limits unless otherwise noted below.  Healing wound with mild erythema  Relevant physical exam findings are noted in the Assessment and Plan.    Assessment & Plan    Healing s/p excision for DN, treated on 03/18/23, healing with 2nd intention - Reassured that wound is healing well - No evidence of infection - No swelling, induration, purulence, dehiscence, or tenderness out of proportion to the clinical exam, see photo above - Discussed that scars take up to 12 months to mature from the date of surgery - Recommend SPF 30+ to scar daily to prevent purple color from UV exposure during scar maturation process - Discussed that erythema and raised appearance of scar will fade over the next 4-6 months - OK to start scar massage at 4-6 weeks post-op - Can consider silicone based products for scar healing starting at 6 weeks post-op - Ok to continue ointment daily to wound under a bandage for another 3 weeks  History of Dysplastic Nevi - No evidence of recurrence today - Recommend regular full body skin exams - Recommend daily broad spectrum  sunscreen SPF 30+ to sun-exposed areas, reapply every 2 hours as needed.  - Call if any new or changing lesions are noted between office visits  Return in about 3 weeks (around 06/01/2023).  I, Wilson Hasten, CMA, am acting as scribe for Deneise Finlay, MD.   Documentation: I have reviewed the above documentation for accuracy and completeness, and I agree with the above.  Deneise Finlay, MD

## 2023-05-11 NOTE — Patient Instructions (Signed)

## 2023-06-03 ENCOUNTER — Ambulatory Visit: Admitting: Dermatology

## 2023-06-03 ENCOUNTER — Encounter: Payer: Self-pay | Admitting: Dermatology

## 2023-06-03 VITALS — BP 143/97 | HR 72

## 2023-06-03 DIAGNOSIS — L539 Erythematous condition, unspecified: Secondary | ICD-10-CM

## 2023-06-03 DIAGNOSIS — Z86018 Personal history of other benign neoplasm: Secondary | ICD-10-CM | POA: Diagnosis not present

## 2023-06-03 DIAGNOSIS — T1490XD Injury, unspecified, subsequent encounter: Secondary | ICD-10-CM

## 2023-06-03 DIAGNOSIS — D239 Other benign neoplasm of skin, unspecified: Secondary | ICD-10-CM

## 2023-06-03 NOTE — Progress Notes (Signed)
   Follow Up Visit   Subjective  Alexis Norton is a 23 y.o. female who presents for the following: follow up from [facility]  The patient presents for follow up from excision for a DN on the right plantar surface of heel, treated on 03/18/23, repaired with second intention. The patient has been bandaging the wound as directed. The endorse the following concerns: concerned about slow healing.  The following portions of the chart were reviewed this encounter and updated as appropriate: medications, allergies, medical history  Review of Systems:  No other skin or systemic complaints except as noted in HPI or Assessment and Plan.  Objective  Well appearing patient in no apparent distress; mood and affect are within normal limits.  A full examination was performed including scalp, head, face and right foot. All findings within normal limits unless otherwise noted below.  Healing wound with mild erythema  Relevant physical exam findings are noted in the Assessment and Plan.    Assessment & Plan   Healing s/p Excision for DN, treated on 03/18/23, repaired with second intention - Reassured that wound is healing well - No evidence of infection - No swelling, induration, purulence, dehiscence, or tenderness out of proportion to the clinical exam, see photo above - Discussed that scars take up to 12 months to mature from the date of surgery - Recommend SPF 30+ to scar daily to prevent purple color from UV exposure during scar maturation process - Discussed that erythema and raised appearance of scar will fade over the next 4-6 months - OK to start scar massage at 4-6 weeks post-op - Can consider silicone based products for scar healing starting at 6 weeks post-op - Ok to continue ointment daily to wound under a bandage for another 2-3 weeks or until completley healed  History of Dysplastic Nevi - No evidence of recurrence today - Recommend regular full body skin exams - Recommend daily  broad spectrum sunscreen SPF 30+ to sun-exposed areas, reapply every 2 hours as needed.  - Call if any new or changing lesions are noted between office visits  Return in about 2 weeks (around 06/17/2023) for wound follow .  I, Haig Levan, Surg Tech III, am acting as scribe for Deneise Finlay, MD.   Documentation: I have reviewed the above documentation for accuracy and completeness, and I agree with the above.  Deneise Finlay, MD

## 2023-06-03 NOTE — Patient Instructions (Addendum)
 Important Information  Due to recent changes in healthcare laws, you may see results of your pathology and/or laboratory studies on MyChart before the doctors have had a chance to review them. We understand that in some cases there may be results that are confusing or concerning to you. Please understand that not all results are received at the same time and often the doctors may need to interpret multiple results in order to provide you with the best plan of care or course of treatment. Therefore, we ask that you please give [place] 2 business days to thoroughly review all your results before contacting the office for clarification. Should we see a critical lab result, you will be contacted sooner.   If You Need Anything After Your Visit  If you have any questions or concerns for your doctor, please call our main line at [phone] If no one answers, please leave a voicemail as directed and we will return your call as soon as possible. Messages left after 4 pm will be answered the following business day.   You may also send [place] a message via MyChart. We typically respond to MyChart messages within 1-2 business days.  For prescription refills, please ask your pharmacy to contact our office. Our fax number is [phone].  If you have an urgent issue when the clinic is closed that cannot wait until the next business day, you can page your doctor at the number below.    Please note that while we do our best to be available for urgent issues outside of office hours, we are not available 24/7.   If you have an urgent issue and are unable to reach [place], you may choose to seek medical care at your doctor's office, retail clinic, urgent care center, or emergency room.  If you have a medical emergency, please immediately call 911 or go to the emergency department. In the event of inclement weather, please call our main line at [phone] for an update on the status of any delays or  closures.  Dermatology Medication Tips: Please keep the boxes that topical medications come in in order to help keep track of the instructions about where and how to use these. Pharmacies typically print the medication instructions only on the boxes and not directly on the medication tubes.   If your medication is too expensive, please contact our office at [phone] or send [place] a message through MyChart.   We are unable to tell what your co-pay for medications will be in advance as this is different depending on your insurance coverage. However, we may be able to find a substitute medication at lower cost or fill out paperwork to get insurance to cover a needed medication.   If a prior authorization is required to get your medication covered by your insurance company, please allow [place] 1-2 business days to complete this process.  Drug prices often vary depending on where the prescription is filled and some pharmacies may offer cheaper prices.  The website www.goodrx.com contains coupons for medications through different pharmacies. The prices here do not account for what the cost may be with help from insurance (it may be cheaper with your insurance), but the website can give you the price if you did not use any insurance.  - You can print the associated coupon and take it with your prescription to the pharmacy.  - You may also stop by our office during regular business hours and pick up a GoodRx coupon card.  - If  you need your prescription sent electronically to a different pharmacy, notify our office through [facility] or by phone at [phone]    Wound Care Instructions for After Surgery  On the day following your surgery, you should begin doing daily dressing changes until your sutures are removed: Remove the bandage. Cleanse the wound gently with soap and water.  Make sure you then dry the skin surrounding the wound completely or the tape will not stick to the skin. Do not  use cotton balls on the wound. After the wound is clean and dry, apply the ointment (either prescription antibiotic prescribed by your doctor or plain Vaseline if nothing was prescribed) gently with a Q-tip. If you are using a bandaid to cover: Apply a bandaid large enough to cover the entire wound. If you do not have a bandaid large enough to cover the wound OR if you are sensitive to bandaid adhesive: Cut a non-stick pad (such as Telfa) to fit the size of the wound.  Cover the wound with the non-stick pad. If the wound is draining, you may want to add a small amount of gauze on top of the non-stick pad for a little added compression to the area. Use tape to seal the area completely.  For the next 1-2 weeks: Be sure to keep the wound moist with ointment 24/7 to ensure best healing. If you are unable to cover the wound with a bandage to hold the ointment in place, you may need to reapply the ointment several times a day. Do not bend over or lift heavy items to reduce the chance of elevated blood pressure to the wound. Do not participate in particularly strenuous activities.  Below is a list of dressing supplies you might need.  Cotton-tipped applicators - Q-tips Gauze pads (2x2 and/or 4x4) - All-Purpose Sponges New and clean tube of petroleum jelly (Vaseline) OR prescription antibiotic ointment if prescribed Either a bandaid large enough to cover the entire wound OR non-stick dressing material (Telfa) and Tape (Paper or Hypafix)  FOR ADULT SURGERY PATIENTS: If you need something for pain relief, you may take 1 extra strength Tylenol (acetaminophen) and 2 ibuprofen (200 mg) together every 4 hours as needed. (Do not take these medications if you are allergic to them or if you know you cannot take them for any other reason). Typically you may only need pain medication for 1-3 days.   Comments on the Post-Operative Period Slight swelling and redness often appear around the wound. This is normal  and will disappear within several days following the surgery. The healing wound will drain a brownish-red-yellow discharge during healing. This is a normal phase of wound healing. As the wound begins to heal, the drainage may increase in amount. Again, this drainage is normal. Notify us if the drainage becomes persistently bloody, excessively swollen, or intensely painful or develops a foul odor or red streaks.  The healing wound will also typically be itchy. This is normal. If you have severe or persistent pain, Notify us if the discomfort is severe or persistent. Avoid alcoholic beverages when taking pain medicine.  In Case of Wound Hemorrhage A wound hemorrhage is when the bandage suddenly becomes soaked with bright red blood and flows profusely. If this happens, sit down or lie down with your head elevated. If the wound has a dressing on it, do not remove the dressing. Apply pressure to the existing gauze. If the wound is not covered, use a gauze pad to apply pressure and  continue applying the pressure for 20 minutes without peeking. DO NOT COVER THE WOUND WITH A LARGE TOWEL OR WASH CLOTH. Release your hand from the wound site but do not remove the dressing. If the bleeding has stopped, gently clean around the wound. Leave the dressing in place for 24 hours if possible. This wait time allows the blood vessels to close off so that you do not spark a new round of bleeding by disrupting the newly clotted blood vessels with an immediate dressing change. If the bleeding does not subside, continue to hold pressure for 40 minutes. If bleeding continues, page your physician, contact an After Hours clinic or go to the Emergency Room.

## 2023-06-08 ENCOUNTER — Other Ambulatory Visit: Payer: Self-pay

## 2023-06-18 ENCOUNTER — Ambulatory Visit (INDEPENDENT_AMBULATORY_CARE_PROVIDER_SITE_OTHER): Admitting: Dermatology

## 2023-06-18 DIAGNOSIS — L720 Epidermal cyst: Secondary | ICD-10-CM | POA: Diagnosis not present

## 2023-06-18 DIAGNOSIS — T1490XD Injury, unspecified, subsequent encounter: Secondary | ICD-10-CM

## 2023-06-18 DIAGNOSIS — D239 Other benign neoplasm of skin, unspecified: Secondary | ICD-10-CM

## 2023-06-18 MED ORDER — DOXYCYCLINE HYCLATE 100 MG PO TABS: 100.0000 mg | ORAL_TABLET | Freq: Two times a day (BID) | ORAL | 0 refills | Status: AC

## 2023-06-18 NOTE — Patient Instructions (Signed)

## 2023-06-18 NOTE — Progress Notes (Signed)
   Follow-Up Visit   Subjective  Alexis Norton is a 23 y.o. female who presents for the following: follow up on excision on the right heel. She has been healing well and denies pain.   She has a lesion of concern on the left upper thigh, present for several months, previously smaller and recently inflamed.   The following portions of the chart were reviewed this encounter and updated as appropriate: medications, allergies, medical history  Review of Systems:  No other skin or systemic complaints except as noted in HPI or Assessment and Plan.  Objective  Well appearing patient in no apparent distress; mood and affect are within normal limits.  A focused examination was performed of the following areas: Right foot and groin  Relevant exam findings are noted in the Assessment and Plan.    Assessment & Plan   EPIDERMAL INCLUSION CYST Exam: Subcutaneous nodule at groin  Benign-appearing. Exam most consistent with an epidermal inclusion cyst. Discussed that a cyst is a benign growth that can grow over time and sometimes get irritated or inflamed. Recommend observation if it is not bothersome. Discussed option of surgical excision to remove it if it is growing, symptomatic, or other changes noted. Please call for new or changing lesions so they can be evaluated.  Meds ordered this encounter  Medications   doxycycline  (VIBRA -TABS) 100 MG tablet    Sig: Take 1 tablet (100 mg total) by mouth 2 (two) times daily for 10 days.    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, repaired with second intention - Reassured that wound is healing well - No evidence of infection - No swelling, induration, purulence, dehiscence, or tenderness out of proportion to the clinical exam, see photo above - Discussed that scars take up to 12 months to mature from the date of surgery - Recommend SPF 30+ to scar daily to prevent purple color from UV exposure  during scar maturation process - Discussed that erythema and raised appearance of scar will fade over the next 4-6 months - OK to start scar massage at 4-6 weeks post-op - Can consider silicone based products for scar healing starting at 6 weeks post-op - Ok to continue ointment daily to wound under a bandage for another 2-3 weeks or until completley healed   Return in about 4 weeks (around 07/16/2023).  Maurine Sovereign, RN, am acting as scribe for Deneise Finlay, MD .  Documentation: I have reviewed the above documentation for accuracy and completeness, and I agree with the above.  Deneise Finlay, MD

## 2023-06-19 ENCOUNTER — Encounter: Payer: Self-pay | Admitting: Dermatology

## 2023-07-28 ENCOUNTER — Encounter: Payer: Self-pay | Admitting: Dermatology

## 2023-07-28 ENCOUNTER — Ambulatory Visit: Admitting: Dermatology

## 2023-07-28 VITALS — BP 123/89 | HR 89

## 2023-07-28 DIAGNOSIS — L905 Scar conditions and fibrosis of skin: Secondary | ICD-10-CM

## 2023-07-28 DIAGNOSIS — Z86018 Personal history of other benign neoplasm: Secondary | ICD-10-CM

## 2023-07-28 DIAGNOSIS — D239 Other benign neoplasm of skin, unspecified: Secondary | ICD-10-CM

## 2023-07-28 NOTE — Patient Instructions (Signed)

## 2023-07-28 NOTE — Progress Notes (Signed)
   Follow-Up Visit   Subjective  Alexis Norton is a 23 y.o. female who presents for the following: follow up on excision on the right heel. She has been healing well and denies pain.   The following portions of the chart were reviewed this encounter and updated as appropriate: medications, allergies, medical history  Review of Systems:  No other skin or systemic complaints except as noted in HPI or Assessment and Plan.  Objective  Well appearing patient in no apparent distress; mood and affect are within normal limits.  A focused examination was performed of the following areas: Right foot  Relevant exam findings are noted in the Assessment and Plan.    Assessment & Plan   Scar s/p Excision for DN, treated on 03/18/23, repaired with second intention - Reassured that wound is healing well - No evidence of infection - No swelling, induration, purulence, dehiscence, or tenderness out of proportion to the clinical exam, see photo above - Discussed that scars take up to 12 months to mature from the date of surgery - Recommend SPF 30+ to scar daily to prevent purple color from UV exposure during scar maturation process - Discussed that erythema and raised appearance of scar will fade over the next 4-6 months - OK to start scar massage at 4-6 weeks post-op - Can consider silicone based products for scar healing starting at 6 weeks post-op - Ok to discontinue ointment daily to wound.   HISTORY OF DYSPLASTIC NEVUS No evidence of recurrence today Recommend regular full body skin exams Recommend daily broad spectrum sunscreen SPF 30+ to sun-exposed areas, reapply every 2 hours as needed.  Call if any new or changing lesions are noted between office visits  Return if symptoms worsen or fail to improve.  I, Berwyn Lesches, Surg Tech III, am acting as scribe for RUFUS CHRISTELLA HOLY, MD.   Documentation: I have reviewed the above documentation for accuracy and completeness, and I agree  with the above.  RUFUS CHRISTELLA HOLY, MD

## 2023-08-25 DIAGNOSIS — H5213 Myopia, bilateral: Secondary | ICD-10-CM | POA: Diagnosis not present

## 2023-08-31 ENCOUNTER — Other Ambulatory Visit: Payer: Self-pay

## 2023-09-03 ENCOUNTER — Ambulatory Visit: Payer: Self-pay | Admitting: Internal Medicine

## 2023-09-03 ENCOUNTER — Ambulatory Visit (INDEPENDENT_AMBULATORY_CARE_PROVIDER_SITE_OTHER): Payer: 59 | Admitting: Internal Medicine

## 2023-09-03 ENCOUNTER — Encounter: Payer: Self-pay | Admitting: Internal Medicine

## 2023-09-03 ENCOUNTER — Telehealth: Payer: Self-pay

## 2023-09-03 VITALS — BP 110/70 | HR 77 | Temp 98.0°F | Ht 66.0 in | Wt 149.2 lb

## 2023-09-03 DIAGNOSIS — Z79899 Other long term (current) drug therapy: Secondary | ICD-10-CM | POA: Diagnosis not present

## 2023-09-03 DIAGNOSIS — E041 Nontoxic single thyroid nodule: Secondary | ICD-10-CM | POA: Insufficient documentation

## 2023-09-03 DIAGNOSIS — Z0001 Encounter for general adult medical examination with abnormal findings: Secondary | ICD-10-CM

## 2023-09-03 LAB — CBC WITH DIFFERENTIAL/PLATELET
Basophils Absolute: 0 K/uL (ref 0.0–0.1)
Basophils Relative: 0.6 % (ref 0.0–3.0)
Eosinophils Absolute: 0.2 K/uL (ref 0.0–0.7)
Eosinophils Relative: 2.5 % (ref 0.0–5.0)
HCT: 39.9 % (ref 36.0–46.0)
Hemoglobin: 13.7 g/dL (ref 12.0–15.0)
Lymphocytes Relative: 38.5 % (ref 12.0–46.0)
Lymphs Abs: 3 K/uL (ref 0.7–4.0)
MCHC: 34.4 g/dL (ref 30.0–36.0)
MCV: 87.7 fl (ref 78.0–100.0)
Monocytes Absolute: 0.4 K/uL (ref 0.1–1.0)
Monocytes Relative: 4.9 % (ref 3.0–12.0)
Neutro Abs: 4.1 K/uL (ref 1.4–7.7)
Neutrophils Relative %: 53.5 % (ref 43.0–77.0)
Platelets: 235 K/uL (ref 150.0–400.0)
RBC: 4.55 Mil/uL (ref 3.87–5.11)
RDW: 12.6 % (ref 11.5–15.5)
WBC: 7.7 K/uL (ref 4.0–10.5)

## 2023-09-03 LAB — LIPID PANEL
Cholesterol: 181 mg/dL (ref 0–200)
HDL: 58.3 mg/dL (ref >39.00)
LDL Cholesterol: 105 mg/dL — ABNORMAL HIGH (ref 0–99)
NonHDL: 122.93
Total CHOL/HDL Ratio: 3
Triglycerides: 91 mg/dL (ref 0.0–149.0)
VLDL: 18.2 mg/dL (ref 0.0–40.0)

## 2023-09-03 LAB — COMPREHENSIVE METABOLIC PANEL WITH GFR
ALT: 12 U/L (ref 0–35)
AST: 13 U/L (ref 0–37)
Albumin: 4.7 g/dL (ref 3.5–5.2)
Alkaline Phosphatase: 38 U/L — ABNORMAL LOW (ref 39–117)
BUN: 12 mg/dL (ref 6–23)
CO2: 24 meq/L (ref 19–32)
Calcium: 9.7 mg/dL (ref 8.4–10.5)
Chloride: 100 meq/L (ref 96–112)
Creatinine, Ser: 0.84 mg/dL (ref 0.40–1.20)
GFR: 97.27 mL/min (ref >60.00)
Glucose, Bld: 60 mg/dL — ABNORMAL LOW (ref 70–99)
Potassium: 3.9 meq/L (ref 3.5–5.1)
Sodium: 140 meq/L (ref 135–145)
Total Bilirubin: 0.7 mg/dL (ref 0.2–1.2)
Total Protein: 8 g/dL (ref 6.0–8.3)

## 2023-09-03 LAB — VITAMIN D 25 HYDROXY (VIT D DEFICIENCY, FRACTURES): VITD: >120 ng/mL

## 2023-09-03 NOTE — Telephone Encounter (Signed)
 CRITICAL VALUE STICKER  CRITICAL VALUE: Vitamin D  greater than 120  RECEIVER (on-site recipient of call): Lydiah Pong C, CMA II  DATE & TIME NOTIFIED: 09/03/23 2:52 pm  MESSENGER (representative from lab):[facility]  MD NOTIFIED: Jesus   TIME OF NOTIFICATION: 2:53 pm  RESPONSE: Call pt and advise to stop taking any OTC or Rx Vitamin D .  Called pt to advise MD recommendations and pt verbalized understanding.

## 2023-09-03 NOTE — Patient Instructions (Addendum)
 VISIT SUMMARY: Today, you had your annual physical exam. We reviewed your recent health history, including your dermatologist visit and minor surgery earlier this year. We discussed your current lifestyle, including your diet, exercise routine, and sleep quality. We also reviewed your family history and potential risk factors for certain conditions. Overall, you are maintaining a healthy lifestyle, and we discussed ways to continue supporting your health.  YOUR PLAN: -ADULT WELLNESS VISIT: An annual wellness visit is a routine check-up to review your overall health and preventive measures. We discussed your diet, exercise, and the importance of maintaining healthy habits. We also talked about the benefits of resistance training, healthy fats, and avoiding trans fats. You are encouraged to continue your current healthy lifestyle and consider participating in the Live Life Well program for additional support.  -THYROID  NODULE, RIGHT LOBE: A thyroid  nodule is a growth in the thyroid  gland. We found a 7 mm nodule in your right thyroid  lobe, which could be benign or a lymph node. Given your higher risk due to occupational radiation exposure, we will do a thyroid  ultrasound and a full thyroid  panel to check its status. Depending on the results, a biopsy might be needed.  -VITAMIN D  SUPPLEMENTATION MANAGEMENT: Vitamin D  is essential for bone health and overall well-being. You are currently taking vitamin D  daily. We discussed potential insurance issues with testing your vitamin D  levels but agreed to check them to ensure proper supplementation and avoid overdose. We will include this test in your blood work.  INSTRUCTIONS: Please schedule a thyroid  ultrasound at [facility] and complete the blood work for a full thyroid  panel and vitamin D  levels. Continue with your current healthy lifestyle, including your diet and exercise routine. Consider participating in the Live Life Well program for additional health  support. We will see you again for your annual follow-up visit next year.  Building Your Long-Term Health Plan  During today's preventive visit, we covered a variety of important health checks to help you stay on top of your well-being.  We also discussed strategies to maintain your health and identified some areas that might benefit from further exploration.   Preventive care visits like today's are designed to be proactive, but sometimes additional attention may be needed.  Rest assured, we're here for you.  If these areas require further evaluation or management, we'd be happy to schedule a separate, focused appointment to address them in detail.  Addressing Next Steps  [x]   Follow-up Visit: To ensure we address any unresolved issues and continue monitoring your overall health, we recommend scheduling a follow-up appointment in 1 year for your next preventive care visit. If you experience any new problems, need to discuss any medical concerns, or your condition worsens before then, please don't hesitate to call our office to schedule an appointment or seek emergency care as needed.  [x]   Preventive Measures: Maintaining healthy habits plays a crucial role in overall wellness. We recommend considering these tips: [x]   Regular appointments with dental and vision professionals [x]   Nightly nasal saline mist to keep sinuses clear [x]   Consistent toothbrushing to maintain oral health [x]   Using an app like SnoreLab to track sleep quality [x]   Routine checks of blood pressure and heart rate [x]   Medical Information: In some instances, we may require additional medical information from other providers to create a comprehensive picture of your health. If applicable, we can provide a medical information release form at the front desk for you to sign, allowing us  to  gather these records. [x]   Lab Tests: If any lab tests were ordered today, scheduling them within a week of your visit helps ensure the best  possible insurance coverage.  Planning Follow Up to Work on a Problem? Make the Most of Our Focused (20 minute) Appointments  [x]   Clearly state your top concerns at the beginning of the visit to focus our discussion [x]   If you anticipate you will need more time, please inform the front desk during scheduling - we can book multiple appointments in the same week. [x]   If you have transportation problems- use our convenient video appointments or ask about transportation support. [x]   We can get down to business faster if you use MyChart to update information before the visit and submit non-urgent questions before your visit. Thank you for taking the time to provide details through MyChart.  Let our nurse know and she can import this information into your encounter documents.  Arrival and Wait Times  [x]   Arriving on time ensures that everyone receives prompt attention. [x]   Early morning (8a) and afternoon (1p) appointments tend to have shortest wait times. [x]   Unfortunately, we cannot delay appointments for late arrivals or hold slots during phone calls.  Bring to Your Next Appointment:  [x]   Medications: Please bring all your medication bottles to your next appointment to ensure we have an accurate record of your prescriptions. [x]   Health Diaries: If you're monitoring any health conditions at home, keeping a diary of your readings can be very helpful for discussions at your next appointment.  Reviewing Your Records  [x]   Review your attached preventive care information at the end of these patient instructions. [x]   Review this early draft of your clinical encounter notes below and the final encounter summary tomorrow on MyChart after its been completed.      Getting Answers and Following Up  [x]   Simple Questions & Concerns: For quick questions or basic follow-up after your visit, reach us  at [phone] or MyChart messaging. [x]   Complex Concerns: If your concern is more complex,  scheduling an appointment might be best. Discuss this with the staff to find the most suitable option. [x]   Lab & Imaging Results: We'll contact you directly if results are abnormal or you don't use MyChart. Most normal results will be on MyChart within 2-3 business days, with a review message from Dr. Jesus. Haven't heard back in 2 weeks? Need results sooner? Contact us  at [phone]. [x]   Referrals: Our referral coordinator will manage specialist referrals. The specialist's office should contact you within 2 weeks to schedule an appointment. Call us  if you haven't heard from them after 2 weeks.  Staying Connected  [x]   MyChart: Activate your MyChart for the fastest way to access results and message us . See the last page of this paperwork for instructions on how to activate.  Billing  [x]   X-ray & Lab Orders: These are billed by separate companies. Contact the invoicing company directly for questions or concerns. [x]   Visit Charges: Discuss any billing inquiries with our administrative services team.  Your Satisfaction Matters  [x]   Share Your Experience: We strive for your satisfaction! If you have any complaints, or preferably compliments, please let Dr. Jesus know directly or contact our Practice Administrators, Manuelita Rubin or [employer], by asking at the front desk.                 Next Steps  [x]   Schedule Follow-Up:  We recommend  a follow-up appointment in 1 year for your next wellness visit.  If you develop any new problems, want to address any medical issues, or your condition worsens before then, please call us  for an appointment or seek emergency care. [x]   Preventive Care:  Make sure to keep regular appointments with dental and vision professionals, use nightly nasal saline mist sprays to keep your sinuses clear and toothbrushing to protect your teeth. Use SnoreLab App or other app to track your sleep quality. Check blood pressure and heart rate  routinely. [x]   Medical Information Release:  For any relevant medical information we don't have, please sign a release form at the front desk so we can obtain it for your records. [x]   Lab Tests:  Schedule any lab tests from today for within a week to ensure best insurance coverage.    Making the Most of Our Focused (20 minute) Appointments:  [x]   Clearly state your top concerns at the beginning of the visit to focus our discussion [x]   If you anticipate you will need more time, please inform the front desk during scheduling - we can book multiple appointments in the same week. [x]   If you have transportation problems- use our convenient video appointments or ask about transportation support. [x]   We can get down to business faster if you use MyChart to update information before the visit and submit non-urgent questions before your visit. Thank you for taking the time to provide details through MyChart.  Let our nurse know and she can import this information into your encounter documents.  Arrival and Wait Times: [x]   Arriving on time ensures that everyone receives prompt attention. [x]   Early morning (8a) and afternoon (1p) appointments tend to have shortest wait times. [x]   Unfortunately, we cannot delay appointments for late arrivals or hold slots during phone calls.  Bring to Your Next Appointment  [x]   Medications: Please bring all your medication bottles to your next appointment to ensure we have an accurate record of your prescriptions. [x]   Health Diaries: If you're monitoring any health conditions at home, keeping a diary of your readings can be very helpful for discussions at your next appointment.  Reviewing Your Records  [x]   Review your attached preventive care information at the end of these patient instructions. [x]   Review this early draft of your clinical encounter notes below and the final encounter summary tomorrow on MyChart after its been completed.   Encounter for  annual general medical examination with abnormal findings in adult -     Lipid panel -     Comprehensive metabolic panel with GFR -     CBC with Differential/Platelet -     VITAMIN D  25 Hydroxy (Vit-D Deficiency, Fractures)  Medication management  Thyroid  nodule -     Thyroid  Panel With TSH -     US  THYROID      Getting Answers and Following Up  [x]   Simple Questions & Concerns: For quick questions or basic follow-up after your visit, reach us  at [phone] or MyChart messaging. [x]   Complex Concerns: If your concern is more complex, scheduling an appointment might be best. Discuss this with the staff to find the most suitable option. [x]   Lab & Imaging Results: We'll contact you directly if results are abnormal or you don't use MyChart. Most normal results will be on MyChart within 2-3 business days, with a review message from Dr. Jesus. Haven't heard back in 2 weeks? Need results sooner? Contact us  at [phone]. [x]   Referrals: Our referral coordinator will manage specialist referrals. The specialist's office should contact you within 2 weeks to schedule an appointment. Call us  if you haven't heard from them after 2 weeks.  Staying Connected  [x]   MyChart: Activate your MyChart for the fastest way to access results and message us . See the last page of this paperwork for instructions on how to activate.  Billing  [x]   X-ray & Lab Orders: These are billed by separate companies. Contact the invoicing company directly for questions or concerns. [x]   Visit Charges: Discuss any billing inquiries with our administrative services team.  Your Satisfaction Matters  [x]   Share Your Experience: We strive for your satisfaction! If you have any complaints, or preferably compliments, please let Dr. Jesus know directly or contact our Practice Administrators, Manuelita Rubin or [employer], by asking at the front desk.    Medical Screening Exam A medical screening exam (MSE) helps  to determine whether you need immediate medical treatment relating to any number of symptoms you are having. This type of exam may be done in an emergency department, an urgent care setting, or your health care provider's office. Depending on your symptoms and severity, you may need additional tests or medical therapy. It is important to note that an MSE does not necessarily mean that you will need or receive further medical testing or interventions if your symptoms are not deemed to be medically urgent (emergent). Tell a health care provider about: Any allergies you have. All medicines you are taking, including vitamins, herbs, eye drops, creams, and over-the-counter medicines. Any problems you or family members have had with anesthetic medicines. Any bleeding problems you have. Any surgeries you have had. Any medical conditions you have. Whether you are pregnant or may be pregnant. What happens during the test? During the exam, a health care provider does a short, often focused, physical exam and asks about your medical history to assess: Your current symptoms. Your overall health. Your need for possible further medical intervention. What can I expect after the test? If you have a regular health care provider, make an appointment for a follow-up visit with him or her. If you do not have a regular health care provider, ask about resources in your community. Your medical screening exam may determine that: You do not need emergency treatment at this time. You need treatment right away. You need to be transferred to another medical center. This may happen if you need an emergent specialist or consultant that is not available at the medical center you are at. You need to have more tests. A medical specialist may be consulted if needed. Get help right away if: Your condition gets worse. You develop new or troubling symptoms before you see your health care provider. These symptoms may represent  a serious problem that is an emergency. Do not wait to see if the symptoms will go away. Get medical help right away. Call your local emergency services (911 in the [place] Do not drive yourself to the hospital. Summary A medical screening exam helps to determine whether you need medical treatment right away. This type of exam may be done in an emergency department, an urgent care setting, or your health care provider's office. During the exam, a health care provider does a short physical exam and asks about your current symptoms and overall health. Depending on the exam, more tests or therapies may be ordered. However, an MSE does not necessarily mean that you will  have further medical testing if your symptoms are not deemed to be urgent. If you need further care that is not offered at your current medical center, you may need to be transferred to another facility. This information is not intended to replace advice given to you by your health care provider. Make sure you discuss any questions you have with your health care provider. Document Revised: 09/26/2020 Document Reviewed: 05/24/2020 Elsevier Patient Education  2024 [employer] Inc.  ?? Trans Fats: What You Need to Know (and How to Avoid Them) Protect Your Heart, Brain, and Overall Health  ? What Are Trans Fats? Trans fats are a type of unhealthy fat that can increase your risk of: Heart disease Stroke Type 2 diabetes Inflammation Memory problems They are artificially made through a process called hydrogenation and were once common in processed foods for better shelf life and texture.  ?? Why Should I Avoid Trans Fats? Even small amounts of trans fats can: Raise "bad" LDL cholesterol Lower "good" HDL cholesterol Cause inflammation in your blood vessels Increase your risk of heart attack or stroke There is no safe level of artificial trans fat.  ?? How to Spot Trans Fats (Even When the Label Says "0g") Food companies can legally say  "0 grams trans fat" if the product contains less than 0.5 grams per serving -- but that can add up fast! Look at the ingredients list for these clues: ?? Partially hydrogenated oil ? this means trans fat is present. ? Avoid foods with "shortening" or "hydrogenated" oils.  ?? Common Foods That May Contain Trans Fats Even today, you may find trans fats in: Baked goods (cookies, cakes, pies) Microwave popcorn Crackers Margarine and shortening Fried fast foods Frozen pizza  ? Healthier Choices Choose products with 0g trans fat and no "partially hydrogenated oil" in the ingredients. Use olive oil, avocado oil, or canola oil for cooking. Eat more whole, unprocessed foods: fruits, vegetables, whole grains, and lean proteins. Choose baked over fried, and fresh over packaged.  ?? Takeaway Message Trans fats are harmful, even in small amounts. To protect your health: Read labels carefully. Look beyond "0g trans fat" and scan for "partially hydrogenated oils." Choose whole foods and heart-healthy fats.      Building Your Long-Term Health Plan  During today's preventive visit, we covered a variety of important health checks to help you stay on top of your well-being.  We also discussed strategies to maintain your health and identified some areas that might benefit from further exploration.   Preventive care visits like today's are designed to be proactive, but sometimes additional attention may be needed.  Rest assured, we're here for you.  If these areas require further evaluation or management, we'd be happy to schedule a separate, focused appointment to address them in detail.  Addressing Next Steps  [x]   Follow-up Visit: To ensure we address any unresolved issues and continue monitoring your overall health, we recommend scheduling a follow-up appointment in 1 year for your next preventive care visit. If you experience any new problems, need to discuss any medical concerns, or your  condition worsens before then, please don't hesitate to call our office to schedule an appointment or seek emergency care as needed.  [x]   Preventive Measures: Maintaining healthy habits plays a crucial role in overall wellness. We recommend considering these tips: [x]   Regular appointments with dental and vision professionals [x]   Nightly nasal saline mist to keep sinuses clear [x]   Consistent toothbrushing to maintain oral health [  x]  Using an app like SnoreLab to track sleep quality [x]   Routine checks of blood pressure and heart rate [x]   Medical Information: In some instances, we may require additional medical information from other providers to create a comprehensive picture of your health. If applicable, we can provide a medical information release form at the front desk for you to sign, allowing us  to gather these records. [x]   Lab Tests: If any lab tests were ordered today, scheduling them within a week of your visit helps ensure the best possible insurance coverage.  Planning Follow Up to Work on a Problem? Make the Most of Our Focused (20 minute) Appointments  [x]   Clearly state your top concerns at the beginning of the visit to focus our discussion [x]   If you anticipate you will need more time, please inform the front desk during scheduling - we can book multiple appointments in the same week. [x]   If you have transportation problems- use our convenient video appointments or ask about transportation support. [x]   We can get down to business faster if you use MyChart to update information before the visit and submit non-urgent questions before your visit. Thank you for taking the time to provide details through MyChart.  Let our nurse know and she can import this information into your encounter documents.  Arrival and Wait Times  [x]   Arriving on time ensures that everyone receives prompt attention. [x]   Early morning (8a) and afternoon (1p) appointments tend to have shortest wait  times. [x]   Unfortunately, we cannot delay appointments for late arrivals or hold slots during phone calls.  Bring to Your Next Appointment:  [x]   Medications: Please bring all your medication bottles to your next appointment to ensure we have an accurate record of your prescriptions. [x]   Health Diaries: If you're monitoring any health conditions at home, keeping a diary of your readings can be very helpful for discussions at your next appointment.  Reviewing Your Records  [x]   Review your attached preventive care information at the end of these patient instructions. [x]   Review this early draft of your clinical encounter notes below and the final encounter summary tomorrow on MyChart after its been completed.      Getting Answers and Following Up  [x]   Simple Questions & Concerns: For quick questions or basic follow-up after your visit, reach us  at [phone] or MyChart messaging. [x]   Complex Concerns: If your concern is more complex, scheduling an appointment might be best. Discuss this with the staff to find the most suitable option. [x]   Lab & Imaging Results: We'll contact you directly if results are abnormal or you don't use MyChart. Most normal results will be on MyChart within 2-3 business days, with a review message from Dr. Jesus. Haven't heard back in 2 weeks? Need results sooner? Contact us  at [phone]. [x]   Referrals: Our referral coordinator will manage specialist referrals. The specialist's office should contact you within 2 weeks to schedule an appointment. Call us  if you haven't heard from them after 2 weeks.  Staying Connected  [x]   MyChart: Activate your MyChart for the fastest way to access results and message us . See the last page of this paperwork for instructions on how to activate.  Billing  [x]   X-ray & Lab Orders: These are billed by separate companies. Contact the invoicing company directly for questions or concerns. [x]   Visit Charges: Discuss  any billing inquiries with our administrative services team.  Your Satisfaction Matters  [  x]  Share Your Experience: We strive for your satisfaction! If you have any complaints, or preferably compliments, please let Dr. Jesus know directly or contact our Practice Administrators, Manuelita Rubin or [employer], by asking at the front desk.                 Next Steps  [x]   Schedule Follow-Up:  We recommend a follow-up appointment in 1 year for your next wellness visit.  If you develop any new problems, want to address any medical issues, or your condition worsens before then, please call us  for an appointment or seek emergency care. [x]   Preventive Care:  Make sure to keep regular appointments with dental and vision professionals, use nightly nasal saline mist sprays to keep your sinuses clear and toothbrushing to protect your teeth. Use SnoreLab App or other app to track your sleep quality. Check blood pressure and heart rate routinely. [x]   Medical Information Release:  For any relevant medical information we don't have, please sign a release form at the front desk so we can obtain it for your records. [x]   Lab Tests:  Schedule any lab tests from today for within a week to ensure best insurance coverage.    Making the Most of Our Focused (20 minute) Appointments:  [x]   Clearly state your top concerns at the beginning of the visit to focus our discussion [x]   If you anticipate you will need more time, please inform the front desk during scheduling - we can book multiple appointments in the same week. [x]   If you have transportation problems- use our convenient video appointments or ask about transportation support. [x]   We can get down to business faster if you use MyChart to update information before the visit and submit non-urgent questions before your visit. Thank you for taking the time to provide details through MyChart.  Let our nurse know and she can import this information  into your encounter documents.  Arrival and Wait Times: [x]   Arriving on time ensures that everyone receives prompt attention. [x]   Early morning (8a) and afternoon (1p) appointments tend to have shortest wait times. [x]   Unfortunately, we cannot delay appointments for late arrivals or hold slots during phone calls.  Bring to Your Next Appointment  [x]   Medications: Please bring all your medication bottles to your next appointment to ensure we have an accurate record of your prescriptions. [x]   Health Diaries: If you're monitoring any health conditions at home, keeping a diary of your readings can be very helpful for discussions at your next appointment.  Reviewing Your Records  [x]   Review your attached preventive care information at the end of these patient instructions. [x]   Review this early draft of your clinical encounter notes below and the final encounter summary tomorrow on MyChart after its been completed.   Encounter for annual general medical examination with abnormal findings in adult -     Lipid panel -     Comprehensive metabolic panel with GFR -     CBC with Differential/Platelet -     VITAMIN D  25 Hydroxy (Vit-D Deficiency, Fractures)  Medication management  Thyroid  nodule -     Thyroid  Panel With TSH -     US  THYROID      Getting Answers and Following Up  [x]   Simple Questions & Concerns: For quick questions or basic follow-up after your visit, reach us  at [phone] or MyChart messaging. [x]   Complex Concerns: If your concern is more complex, scheduling  an appointment might be best. Discuss this with the staff to find the most suitable option. [x]   Lab & Imaging Results: We'll contact you directly if results are abnormal or you don't use MyChart. Most normal results will be on MyChart within 2-3 business days, with a review message from Dr. Jesus. Haven't heard back in 2 weeks? Need results sooner? Contact us  at [phone]. [x]   Referrals: Our referral  coordinator will manage specialist referrals. The specialist's office should contact you within 2 weeks to schedule an appointment. Call us  if you haven't heard from them after 2 weeks.  Staying Connected  [x]   MyChart: Activate your MyChart for the fastest way to access results and message us . See the last page of this paperwork for instructions on how to activate.  Billing  [x]   X-ray & Lab Orders: These are billed by separate companies. Contact the invoicing company directly for questions or concerns. [x]   Visit Charges: Discuss any billing inquiries with our administrative services team.  Your Satisfaction Matters  [x]   Share Your Experience: We strive for your satisfaction! If you have any complaints, or preferably compliments, please let Dr. Jesus know directly or contact our Practice Administrators, Manuelita Rubin or [employer], by asking at the front desk.    Medical Screening Exam A medical screening exam (MSE) helps to determine whether you need immediate medical treatment relating to any number of symptoms you are having. This type of exam may be done in an emergency department, an urgent care setting, or your health care provider's office. Depending on your symptoms and severity, you may need additional tests or medical therapy. It is important to note that an MSE does not necessarily mean that you will need or receive further medical testing or interventions if your symptoms are not deemed to be medically urgent (emergent). Tell a health care provider about: Any allergies you have. All medicines you are taking, including vitamins, herbs, eye drops, creams, and over-the-counter medicines. Any problems you or family members have had with anesthetic medicines. Any bleeding problems you have. Any surgeries you have had. Any medical conditions you have. Whether you are pregnant or may be pregnant. What happens during the test? During the exam, a health care provider does a  short, often focused, physical exam and asks about your medical history to assess: Your current symptoms. Your overall health. Your need for possible further medical intervention. What can I expect after the test? If you have a regular health care provider, make an appointment for a follow-up visit with him or her. If you do not have a regular health care provider, ask about resources in your community. Your medical screening exam may determine that: You do not need emergency treatment at this time. You need treatment right away. You need to be transferred to another medical center. This may happen if you need an emergent specialist or consultant that is not available at the medical center you are at. You need to have more tests. A medical specialist may be consulted if needed. Get help right away if: Your condition gets worse. You develop new or troubling symptoms before you see your health care provider. These symptoms may represent a serious problem that is an emergency. Do not wait to see if the symptoms will go away. Get medical help right away. Call your local emergency services (911 in the [place] Do not drive yourself to the hospital. Summary A medical screening exam helps to determine whether you need medical treatment  right away. This type of exam may be done in an emergency department, an urgent care setting, or your health care provider's office. During the exam, a health care provider does a short physical exam and asks about your current symptoms and overall health. Depending on the exam, more tests or therapies may be ordered. However, an MSE does not necessarily mean that you will have further medical testing if your symptoms are not deemed to be urgent. If you need further care that is not offered at your current medical center, you may need to be transferred to another facility. This information is not intended to replace advice given to you by your health care provider. Make  sure you discuss any questions you have with your health care provider. Document Revised: 09/26/2020 Document Reviewed: 05/24/2020 [employer]vier Patient Education  2024 [employer].

## 2023-09-03 NOTE — Progress Notes (Signed)
 [facility] at [facility] [address] [place], [place] [address] Office:  [phone]  -- Annual Preventive Medical Office Visit --  Patient:  Alexis Norton      Age: 23 y.o.       Sex:  female  Date:   09/03/2023 Patient Care Team: Jesus Bernardino MATSU, MD as PCP - General (Internal Medicine) Hobart Powell BRAVO, MD (Inactive) as PCP - Cardiology (Cardiology) Paylor, Myrick HERO, MD as [place] (Student) Today's Healthcare Provider: Bernardino MATSU Jesus, MD  ========================================= Chief complaint: Annual Exam (Pt is present for cpe is not fasting.)  Purpose of Visit: Comprehensive preventive health assessment and personalized health maintenance planning.  This encounter was conducted as a Comprehensive Physical Exam (CPE) preventive care annual visit. The patient's medical history and problem list were reviewed to inform individualized preventive care recommendations.  No problem-specific medical treatment enough to warrant a separate additional service charge was provided during this visit.    Assessment & Plan Encounter for annual general medical examination with abnormal findings in adult An annual wellness visit was conducted, covering insurance coverage for physicals and potential issues with overlapping GYN and primary care visits. Preventive health measures were reviewed, including recent dermatology and eye appointments. Dietary habits, weight management, and exercise routines were discussed, with encouragement to continue healthy lifestyle choices and resistance training to maintain muscle mass. The importance of healthy fats and avoiding trans fats was emphasized. Family history and potential risk factors for osteoporosis and other conditions were reviewed. The benefits of a life coach for external motivation in maintaining health goals were discussed. Limit lab work to essential tests to avoid insurance issues. Encourage resistance  training, maintaining protein intake, consumption of healthy fats like extra virgin olive oil, and avoidance of trans fats and processed foods. Use saline nasal sprays for pollen removal. Participate in the Live Life Well program for additional health support. Schedule an annual follow-up visit. Medication management She takes vitamin D  daily. Potential insurance issues with covering vitamin D  level testing were discussed. Agreed to check vitamin D  levels to manage supplementation and avoid overdose. The importance of maintaining appropriate vitamin D  levels for overall health was discussed. Check vitamin D  levels in blood work and use the ICD code for medication management to support insurance coverage for vitamin D  testing. Thyroid  nodule A 7 mm thyroid  nodule was identified in the right lobe. Potential differential diagnoses include a benign thyroid  nodule or lymph node. 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 Previous thyroid  function tests were normal. Further evaluation is planned to rule out serious conditions. Order a thyroid  ultrasound at [facility] and perform a full thyroid  panel in blood work. Monitor for any changes in size or characteristics of the nodule. Discuss the potential need for a biopsy if recommended based on ultrasound findings.  Wt Readings from Last 10 Encounters:  09/03/23 149 lb 3.2 oz (67.7 kg)  11/12/22 159 lb 9.6 oz (72.4 kg)  09/18/22 158 lb 11.7 oz (72 kg)  09/02/22 159 lb (72.1 kg)  08/30/21 160 lb 3.2 oz (72.7 kg)  04/20/20 154 lb (69.9 kg)  03/27/20 157 lb (71.2 kg)  02/07/20 153 lb 3.2 oz (69.5 kg)  02/03/20 156 lb 12.8 oz (71.1 kg)  12/30/19 148 lb (67.1 kg)    BMI Readings from Last 30 Encounters:  09/03/23 24.08 kg/m  11/12/22 25.76 kg/m  09/18/22 25.62 kg/m  09/02/22 24.54 kg/m  08/30/21 24.72 kg/m  04/20/20 23.76 kg/m  03/27/20 24.23 kg/m  02/07/20 23.29 kg/m  02/03/20 23.84 kg/m   12/30/19 22.50 kg/m   ORDERS:    Orders Placed This Encounter  Procedures   US  THYROID     Scheduling Instructions:     Works with radiation, want done at [facility] as[facility]    Reason for Exam (SYMPTOM  OR DIAGNOSIS REQUIRED):   thyroid     Preferred imaging location?:   [place]    Release to patient:   Immediate   Lipid panel    [place]    Has the patient fasted?:   No    Release to patient:   Immediate [1]   Comprehensive metabolic panel with GFR    Has the patient fasted?:   No    Release to patient:   Immediate [1]   CBC with Differential/Platelet    Release to patient:   Immediate [1]   Vitamin D  (25 hydroxy)   Thyroid  Panel With TSH    All of the following was reviewed with patient during today's Comprehensive Physical Exam (CPE) preventive care annual visit: HEALTH MAINTENANCE COUNSELING AND ANTICIPATORY GUIDANCE  Reviewed the following verbally with patient and provided AVS materials: Preventive Measure Recommendation  Eye Exams Every 1-2 years; negative eye Dr. Visit next week  Dental Care Cleanings every 6 months or more, brush/floss 3x daily.  She is doing, next is in next month or two.    Sinus Care Saline spray rinses daily encouraged.  Sleep 8 hours nightly, good sleep hygiene, e-monitoring if any daytime drowsiness  Diet Fruits/vegetables/fiber/healthy fats, balance and moderation; she is doing cauliflower, and Extra Virgin Olive Oil   Exercise 150 minutes weekly- used to dance now does elliptical lifts heavy patients  Risk Behaviors Discouraged any/all high risk behaviors   Bone Health Recommended to maintain a good source of calcium and vitamin D  in diet.  She denies any personal history of early osteoporosis or fragility fractures She takes vitamin D  already and we will check after shared decision making   Anticipatory Counseling Recommend absolute abstinence from all vaped or smoked recreational or illicit substances of abuse such as tobacco, nicotine,  alcohol, illicit drugs, even sugar.   Casual alcohol. Safe driving / no text and driving.  No adventure sports.    Health Maintenance Health Maintenance Due  Topic Date Due   CHLAMYDIA SCREENING  12/30/2020   INFLUENZA VACCINE  08/28/2023   Health Maintenance  Topic Date Due   CHLAMYDIA SCREENING  12/30/2020   INFLUENZA VACCINE  08/28/2023   Cervical Cancer Screening (Pap smear)  10/26/2025   DTaP/Tdap/Td (9 - Td or Tdap) 09/17/2032   Hepatitis B Vaccines  Completed   HPV VACCINES  Completed   Hepatitis C Screening  Completed   HIV Screening  Completed   Meningococcal B Vaccine  Completed   COVID-19 Vaccine  Discontinued  Reviewed/Encouraged completion.  Immunizations Immunization History  Administered Date(s) Administered   DTaP 06/17/1999, 08/19/1999, 10/21/1999, 11/05/2000, 11/28/2003   HPV Quadrivalent 09/16/2010, 02/13/2011, 06/13/2011   Hepatitis B, PED/ADOLESCENT 03/19/1999, 05/16/1999, 10/21/1999   IPV 06/17/1999, 08/19/1999, 11/05/2000, 11/28/2003   Influenza, Seasonal, Injecte, Preservative Fre 12/02/2013   MMR 05/11/2000, 11/28/2003   Meningococcal B, OMV 09/14/2017, 10/15/2017   Meningococcal Mcv4o 09/16/2010, 09/14/2017   PFIZER(Purple Top)SARS-COV-2 Vaccination 09/22/2019, 10/13/2019   Tdap 09/16/2010, 09/14/2017, 09/18/2022   Varicella 06/07/2002, 09/14/2017  Reviewed.   CANCER SCREENING SHARED DECISION MAKING   Colon HM Colonoscopy   This patient has no relevant Health  Maintenance data.   .  Health Maintenance  Topic Date Due   CHLAMYDIA SCREENING  12/30/2020   INFLUENZA VACCINE  08/28/2023   Cervical Cancer Screening (Pap smear)  10/26/2025   DTaP/Tdap/Td (9 - Td or Tdap) 09/17/2032   Hepatitis B Vaccines  Completed   HPV VACCINES  Completed   Hepatitis C Screening  Completed   HIV Screening  Completed   Meningococcal B Vaccine  Completed   COVID-19 Vaccine  Discontinued   Inquired about any gastrointestinal bleeding/family history   Lung Current  guidelines recommend individuals aged 61 to 55 who currently smoke or formerly smoked and have a >= 20 pack-year smoking history should undergo annual screening with low-dose computed tomography (LDCT). Tobacco Use: Low Risk  (09/03/2023)   Patient History    Smoking Tobacco Use: Never    Smokeless Tobacco Use: Never    Passive Exposure: Not on file    Skin Advised regular sunscreen use. Patient denies worrisome, changing, or new skin lesions. Offered to include images in chart for surveillance. Showed patient these pictures of melanomas for reference to educate for self-monitoring.  She following with dermatology annual.   Gynecological No known family history of breast cancer. Result Date Procedure Results Follow-ups  10/27/2022 HM PAP SMEAR HM Pap smear: NILM HPV, high-risk: Negative    HM Mammogram   This patient has no relevant Health Maintenance data.   No results found. Cervical, breast, uterine, and ovarian cancer screenings require a dedicated well-woman exam with a gynecology specialist which were not included in today's annual primary care preventive visit.  Safe sex is encouraged, but routine urinalysis sexual transmitted infection screenings in asymptomatic patients are not recommended per guidelines.  Patient verbally acknowledged understanding and intent to maintain routine gynecology appointment(s).   She will see gynecology next month       Thyroid  Palpated thyroid  -no nodules of concern(s).  Other Cancers Discussed lack of screening guidelines and insurance coverage for other cancer types.    Discussed the use of AI scribe software for clinical note transcription with the patient, who gave verbal consent to proceed.  History of Present Illness Alexis Norton is a 23 year old female who presents for an annual physical exam.  She has met her deductible this year due to a dermatologist appointment in January where two spots were checked, one of which was a dysplastic  nevus with severe atypical cells that required removal. She also underwent a minor in-office surgery in February.  She takes vitamin D  daily. She has lost approximately 10 pounds since her last visit, now weighing 149 pounds. She reports meal prepping with higher protein, lower calorie meals, and incorporating more vegetables like cauliflower rice.  Her exercise routine includes using an elliptical and being active at work, where she walks frequently and occasionally assists with moving patients. She does not engage in smoking, vaping, or excessive alcohol consumption.  She reports good sleep quality, never needing naps, and does not snore regularly. She has no known family history of early osteoporosis or thyroid  disease.  She has a history of high blood pressure readings, but recent measurements have been normal.  She has a family history of melanoma, as her mother had a tonsil removed that was benign after lighting up on a PET scan. She has been tested for autoimmune diseases in the past, all of which were negative.   ROS A comprehensive ROS was negative for any concerning symptoms.   Completed medication reconciliation: Current Outpatient  Medications on File Prior to Visit  Medication Sig   ascorbic acid (VITAMIN C) 500 MG tablet Take 500 mg by mouth daily.   Cholecalciferol (VITAMIN D3) 25 MCG (1000 UT) CAPS daily.   ELDERBERRY PO Take 1 tablet by mouth daily.   fexofenadine (ALLEGRA ALLERGY) 180 MG tablet Take 1 tablet every day by oral route.   fluticasone  (FLONASE ) 50 MCG/ACT nasal spray Place 1 spray into both nostrils daily.   Magnesium 200 MG TABS 2 tablet orally once daily   norgestrel -ethinyl estradiol  (ELINEST ) 0.3-30 MG-MCG tablet Take 1 tablet by mouth daily.   Probiotic Product (PROBIOTIC PO) Take 1 capsule by mouth daily.   mupirocin  ointment (BACTROBAN ) 2 % Apply 1 Application topically 2 (two) times daily.   No current facility-administered medications on file prior to  visit.  There are no discontinued medications.The following were reviewed and/or entered/updated into our electronic MEDICAL RECORD NUMBERPast Medical History:  Diagnosis Date   Allergy    Chest tightness    COVID    Febrile seizures (HCC)    Fever    Headache    Leg pain    Migraine 09/02/2022   Palpitations    Penicillin allergy 08/30/2021   Pharyngitis    Respiratory infection    Seizures (HCC)    Sore throat    Tachycardia    Past Surgical History:  Procedure Laterality Date   TONSILLECTOMY Bilateral 2023   for recurrent tonsillitis   WISDOM TOOTH EXTRACTION  2019   Social History   Socioeconomic History   Marital status: Married    Spouse name: Aliegha Paullin   Number of children: Not on file   Years of education: Not on file   Highest education level: Associate degree: academic program  Occupational History   Not on file  Tobacco Use   Smoking status: Never   Smokeless tobacco: Never  Vaping Use   Vaping status: Never Used  Substance and Sexual Activity   Alcohol use: Yes    Alcohol/week: 1.0 standard drink of alcohol    Types: 1 Glasses of wine per week    Comment: Casual/social on the weekends   Drug use: Not Currently   Sexual activity: Yes    Birth control/protection: Pill  Other Topics Concern   Not on file  Social History Narrative   Not on file   Social Drivers of Health   Financial Resource Strain: Medium Risk (08/30/2021)   Overall Financial Resource Strain (CARDIA)    Difficulty of Paying Living Expenses: Somewhat hard  Food Insecurity: No Food Insecurity (08/30/2021)   Hunger Vital Sign    Worried About Running Out of Food in the Last Year: Never true    Ran Out of Food in the Last Year: Never true  Transportation Needs: No Transportation Needs (08/30/2021)   PRAPARE - Administrator, Civil Service (Medical): No    Lack of Transportation (Non-Medical): No  Physical Activity: Insufficiently Active (08/30/2021)   Exercise Vital Sign     Days of Exercise per Week: 3 days    Minutes of Exercise per Session: 30 min  Stress: No Stress Concern Present (08/30/2021)   [employer] of Occupational Health - Occupational Stress Questionnaire    Feeling of Stress : Only a little  Social Connections: Moderately Integrated (08/30/2021)   Social Connection and Isolation Panel    Frequency of Communication with Friends and Family: More than three times a week    Frequency of Social Gatherings with Friends  and Family: Once a week    Attends Religious Services: More than 4 times per year    Active Member of Clubs or Organizations: No    Attends Banker Meetings: Never    Marital Status: Married  Catering manager Violence: Not At Risk (08/30/2021)   Humiliation, Afraid, Rape, and Kick questionnaire    Fear of Current or Ex-Partner: No    Emotionally Abused: No    Physically Abused: No    Sexually Abused: No      08/30/2021   10:40 AM  Alcohol Use Disorder Test (AUDIT)  1. How often do you have a drink containing alcohol? 2  2. How many drinks containing alcohol do you have on a typical day when you are drinking? 1  3. How often do you have six or more drinks on one occasion? 1  AUDIT-C Score 4  4. How often during the last year have you found that you were not able to stop drinking once you had started? 0  5. How often during the last year have you failed to do what was normally expected from you because of drinking? 0  6. How often during the last year have you needed a first drink in the morning to get yourself going after a heavy drinking session? 0  7. How often during the last year have you had a feeling of guilt of remorse after drinking? 0  8. How often during the last year have you been unable to remember what happened the night before because you had been drinking? 0  9. Have you or someone else been injured as a result of your drinking? 0  10. Has a relative or friend or a doctor or another health worker been  concerned about your drinking or suggested you cut down? 0  Alcohol Use Disorder Identification Test Final Score (AUDIT) 4   Family History  Problem Relation Age of Onset   Hypertension Mother    Hypercholesterolemia Mother    Cancer Mother    Hypertension Father    Arthritis Father    Hypercholesterolemia Father    Rheum arthritis Paternal Uncle    Ovarian cancer Maternal Grandmother    Hypertension Maternal Grandmother    Hypercholesterolemia Maternal Grandmother    Heart disease Maternal Grandfather    Cancer Maternal Grandfather    Diabetes Maternal Grandfather    Hearing loss Maternal Grandfather    Heart attack Maternal Grandfather    Congenital heart disease Maternal Grandfather    Hypercholesterolemia Maternal Grandfather    Hypertension Maternal Grandfather    Celiac disease Paternal Grandmother    Hypercholesterolemia Paternal Grandmother    Stroke Paternal Grandmother    Heart disease Paternal Grandfather    Asthma Paternal Grandfather    COPD Paternal Grandfather    Heart attack Paternal Grandfather    Arthritis Paternal Uncle    Stroke Paternal Uncle    Allergies  Allergen Reactions   Cefdinir Anaphylaxis   Ceftriaxone  Anaphylaxis   Penicillin G Hives and Anaphylaxis   Cephalosporins Hives and Itching   Sulfa Antibiotics Hives and Itching   Sulfamethoxazole-Trimethoprim Hives, Itching and Rash   Succinylsulphathiazole Hives   Social History   Substance and Sexual Activity  Sexual Activity Yes   Birth control/protection: Pill   Social History   Tobacco Use   Smoking status: Never   Smokeless tobacco: Never  Vaping Use   Vaping status: Never Used  Substance Use Topics   Alcohol use: Yes  Alcohol/week: 1.0 standard drink of alcohol    Types: 1 Glasses of wine per week    Comment: Casual/social on the weekends   Drug use: Not Currently      09/03/2023   10:05 AM  Depression screen PHQ 2/9  Decreased Interest 0  Down, Depressed, Hopeless 0   PHQ - 2 Score 0      09/03/2023   10:05 AM  Fall Risk   Falls in the past year? 0  Number falls in past yr: 0  Injury with Fall? 0  Risk for fall due to : No Fall Risks  Follow up Falls evaluation completed     BP 110/70   Pulse 77   Temp 98 F (36.7 C) (Temporal)   Ht 5' 6 (1.676 m)   Wt 149 lb 3.2 oz (67.7 kg)   LMP 08/14/2023 (Approximate)   SpO2 99%   BMI 24.08 kg/m  BP Readings from Last 3 Encounters:  09/03/23 110/70  07/28/23 123/89  06/03/23 (!) 143/97   Wt Readings from Last 10 Encounters:  09/03/23 149 lb 3.2 oz (67.7 kg)  11/12/22 159 lb 9.6 oz (72.4 kg)  09/18/22 158 lb 11.7 oz (72 kg)  09/02/22 159 lb (72.1 kg)  08/30/21 160 lb 3.2 oz (72.7 kg)  04/20/20 154 lb (69.9 kg)  03/27/20 157 lb (71.2 kg)  02/07/20 153 lb 3.2 oz (69.5 kg)  02/03/20 156 lb 12.8 oz (71.1 kg)  12/30/19 148 lb (67.1 kg)  Physical Exam Verbalized:   GEN: No acute distress, resting comfortably. Very little truncal adiposity  HEENT: Tympanic membranes normal appearing bilaterally, oropharynx clear, no thyromegaly noted, no palpable lymphadenopathy but ?7mm right lobe thyroid  nodules.Teeth straight, good condition. NECK: Thyroid  nodule present.  CARDIOVASCULAR: S1 and S2 heart sounds with regular rate and rhythm, no murmurs appreciated. PULMONARY: Normal work of breathing, clear to auscultation bilaterally, no crackles, wheezes, or rhonchi. ABDOMEN: Soft, nontender, nondistended. MSK: No edema, cyanosis, or clubbing noted. SKIN: Warm, dry, no lesions of concern observed. NEUROLOGICAL: Cranial nerves II-XII grossly intact, strength 5/5 in upper and lower extremities, reflexes symmetric and intact bilaterally. PSYCH: Normal affect and thought content, pleasant and cooperative.      ======================================  Notes:  This document was synthesized by artificial intelligence (Abridge) using HIPAA-compliant recording of the clinical interaction;   We discussed the use  of AI scribe software for clinical note transcription with the patient, who gave verbal consent to proceed.    This encounter employed state-of-the-art, real-time, collaborative documentation. The patient was empowered to actively review and assist in updating their electronic medical record on a shared monitor, ensuring transparency and improving accuracy.    Prior to and at the beginning of Comprehensive Physical Exam (CPE) preventive care annual visit appointment types  we clarify to patients Our goal today is to focus on your preventive or annual Comprehensive Physical Exam (CPE) preventive care annual visit, which typically covers routine screenings and overall health maintenance. However, if you share any new or concerning symptoms--such as dizziness, passing out, severe pain, or anything else that may point to a more serious issue--we are both legally and ethically required to evaluate it. We cannot simply overlook or ignore such concerns, even if you later decide you don't want to discuss them, because it could jeopardize your health.  If addressing a new concern takes us  beyond the scope of the preventive visit, we may need to bill separately for that portion of care. We understand financial considerations are  important, and we're happy to discuss your options if something new comes up. However, we want to be clear that once you mention a potentially serious issue, we must investigate it; we can't ethically or legally exclude that from our records or our evaluation. Please let us  know all of your questions or worries. Together, we can decide how best to manage them and how to minimize any unexpected costs, but we want to keep you safe above all else.   This disclosure is mandated by professional ethics and legal obligations, as healthcare providers must address any substantial health concerns raised during any patient interaction and a comprehensive ROS is required by insurance companies for billing  preventive-care visit type.   This disclosure ultimately discourages patients financially from reporting significant health issues.  In addition to this disclaimer, all orders placed during this encounter were agreed upon after shared decision making, with no guarantees of insurance coverage able to be provided.    IMPORTANT HEALTH REMINDERS: Report any new or changing skin lesions promptly Maintain recommended screening schedules Discuss any new family history of cancer at future visits Follow up on any new symptoms that persist more than two weeks    Medical Screening Exam A medical screening exam (MSE) helps to determine whether you need immediate medical treatment relating to any number of symptoms you are having. This type of exam may be done in an emergency department, an urgent care setting, or your health care provider's office. Depending on your symptoms and severity, you may need additional tests or medical therapy. It is important to note that an MSE does not necessarily mean that you will need or receive further medical testing or interventions if your symptoms are not deemed to be medically urgent (emergent). Tell a health care provider about: Any allergies you have. All medicines you are taking, including vitamins, herbs, eye drops, creams, and over-the-counter medicines. Any problems you or family members have had with anesthetic medicines. Any bleeding problems you have. Any surgeries you have had. Any medical conditions you have. Whether you are pregnant or may be pregnant. What happens during the test? During the exam, a health care provider does a short, often focused, physical exam and asks about your medical history to assess: Your current symptoms. Your overall health. Your need for possible further medical intervention. What can I expect after the test? If you have a regular health care provider, make an appointment for a follow-up visit with him or her. If you do  not have a regular health care provider, ask about resources in your community. Your medical screening exam may determine that: You do not need emergency treatment at this time. You need treatment right away. You need to be transferred to another medical center. This may happen if you need an emergent specialist or consultant that is not available at the medical center you are at. You need to have more tests. A medical specialist may be consulted if needed. Get help right away if: Your condition gets worse. You develop new or troubling symptoms before you see your health care provider. These symptoms may represent a serious problem that is an emergency. Do not wait to see if the symptoms will go away. Get medical help right away. Call your local emergency services (911 in the [place] Do not drive yourself to the hospital. Summary A medical screening exam helps to determine whether you need medical treatment right away. This type of exam may be done in an emergency department, an  urgent care setting, or your health care provider's office. During the exam, a health care provider does a short physical exam and asks about your current symptoms and overall health. Depending on the exam, more tests or therapies may be ordered. However, an MSE does not necessarily mean that you will have further medical testing if your symptoms are not deemed to be urgent. If you need further care that is not offered at your current medical center, you may need to be transferred to another facility. This information is not intended to replace advice given to you by your health care provider. Make sure you discuss any questions you have with your health care provider. Document Revised: 09/26/2020 Document Reviewed: 05/24/2020 Elsevier Patient Education  2024 [employer]  Health Maintenance, Female Adopting a healthy lifestyle and getting preventive care are important in promoting health and wellness. Ask your health  care provider about: The right schedule for you to have regular tests and exams. Things you can do on your own to prevent diseases and keep yourself healthy. What should I know about diet, weight, and exercise? Eat a healthy diet  Eat a diet that includes plenty of vegetables, fruits, low-fat dairy products, and lean protein. Do not eat a lot of foods that are high in solid fats, added sugars, or sodium. Maintain a healthy weight Body mass index (BMI) is used to identify weight problems. It estimates body fat based on height and weight. Your health care provider can help determine your BMI and help you achieve or maintain a healthy weight. Get regular exercise Get regular exercise. This is one of the most important things you can do for your health. Most adults should: Exercise for at least 150 minutes each week. The exercise should increase your heart rate and make you sweat (moderate-intensity exercise). Do strengthening exercises at least twice a week. This is in addition to the moderate-intensity exercise. Spend less time sitting. Even light physical activity can be beneficial. Watch cholesterol and blood lipids Have your blood tested for lipids and cholesterol at 24 years of age, then have this test every 5 years. Have your cholesterol levels checked more often if: Your lipid or cholesterol levels are high. You are older than 24 years of age. You are at high risk for heart disease. What should I know about cancer screening? Depending on your health history and family history, you may need to have cancer screening at various ages. This may include screening for: Breast cancer. Cervical cancer. Colorectal cancer. Skin cancer. Lung cancer. What should I know about heart disease, diabetes, and high blood pressure? Blood pressure and heart disease High blood pressure causes heart disease and increases the risk of stroke. This is more likely to develop in people who have high blood  pressure readings or are overweight. Have your blood pressure checked: Every 3-5 years if you are 13-57 years of age. Every year if you are 40 years old or older. Diabetes Have regular diabetes screenings. This checks your fasting blood sugar level. Have the screening done: Once every three years after age 28 if you are at a normal weight and have a low risk for diabetes. More often and at a younger age if you are overweight or have a high risk for diabetes. What should I know about preventing infection? Hepatitis B If you have a higher risk for hepatitis B, you should be screened for this virus. Talk with your health care provider to find out if you are at risk  for hepatitis B infection. Hepatitis C Testing is recommended for: Everyone born from [birth year] through 1966. Anyone with known risk factors for hepatitis C. Sexually transmitted infections (STIs) Get screened for STIs, including gonorrhea and chlamydia, if: You are sexually active and are younger than 24 years of age. You are older than 24 years of age and your health care provider tells you that you are at risk for this type of infection. Your sexual activity has changed since you were last screened, and you are at increased risk for chlamydia or gonorrhea. Ask your health care provider if you are at risk. Ask your health care provider about whether you are at high risk for HIV. Your health care provider may recommend a prescription medicine to help prevent HIV infection. If you choose to take medicine to prevent HIV, you should first get tested for HIV. You should then be tested every 3 months for as long as you are taking the medicine. Pregnancy If you are about to stop having your period (premenopausal) and you may become pregnant, seek counseling before you get pregnant. Take 400 to 800 micrograms (mcg) of folic acid every day if you become pregnant. Ask for birth control (contraception) if you want to prevent  pregnancy. Osteoporosis and menopause Osteoporosis is a disease in which the bones lose minerals and strength with aging. This can result in bone fractures. If you are 10 years old or older, or if you are at risk for osteoporosis and fractures, ask your health care provider if you should: Be screened for bone loss. Take a calcium or vitamin D  supplement to lower your risk of fractures. Be given hormone replacement therapy (HRT) to treat symptoms of menopause. Follow these instructions at home: Alcohol use Do not drink alcohol if: Your health care provider tells you not to drink. You are pregnant, may be pregnant, or are planning to become pregnant. If you drink alcohol: Limit how much you have to: 0-1 drink a day. Know how much alcohol is in your drink. In the [place] one drink equals one 12 oz bottle of beer (355 mL), one 5 oz glass of wine (148 mL), or one 1 oz glass of hard liquor (44 mL). Lifestyle Do not use any products that contain nicotine or tobacco. These products include cigarettes, chewing tobacco, and vaping devices, such as e-cigarettes. If you need help quitting, ask your health care provider. Do not use street drugs. Do not share needles. Ask your health care provider for help if you need support or information about quitting drugs. General instructions Schedule regular health, dental, and eye exams. Stay current with your vaccines. Tell your health care provider if: You often feel depressed. You have ever been abused or do not feel safe at home. Summary Adopting a healthy lifestyle and getting preventive care are important in promoting health and wellness. Follow your health care provider's instructions about healthy diet, exercising, and getting tested or screened for diseases. Follow your health care provider's instructions on monitoring your cholesterol and blood pressure. This information is not intended to replace advice given to you by your health care provider.  Make sure you discuss any questions you have with your health care provider. Document Revised: 06/04/2020 Document Reviewed: 06/04/2020 Elsevier Patient Education  2024 [employer].

## 2023-09-03 NOTE — Assessment & Plan Note (Signed)
 She takes vitamin D  daily. Potential insurance issues with covering vitamin D  level testing were discussed. Agreed to check vitamin D  levels to manage supplementation and avoid overdose. The importance of maintaining appropriate vitamin D  levels for overall health was discussed. Check vitamin D  levels in blood work and use the ICD code for medication management to support insurance coverage for vitamin D  testing.

## 2023-09-03 NOTE — Assessment & Plan Note (Signed)
 A 7 mm thyroid  nodule was identified in the right lobe. Potential differential diagnoses include a benign thyroid  nodule or lymph node. 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 Previous thyroid  function tests were normal. Further evaluation is planned to rule out serious conditions. Order a thyroid  ultrasound at [facility] and perform a full thyroid  panel in blood work. Monitor for any changes in size or characteristics of the nodule. Discuss the potential need for a biopsy if recommended based on ultrasound findings.

## 2023-09-04 LAB — THYROID PANEL WITH TSH
Free Thyroxine Index: 3 (ref 1.4–3.8)
T3 Uptake: 29 % (ref 22–35)
T4, Total: 10.2 ug/dL (ref 5.1–11.9)
TSH: 2.2 m[IU]/L

## 2023-09-04 NOTE — Progress Notes (Signed)
Confirm receipt

## 2023-09-07 ENCOUNTER — Ambulatory Visit (HOSPITAL_COMMUNITY)
Admission: RE | Admit: 2023-09-07 | Discharge: 2023-09-07 | Disposition: A | Discharge status: Home / Self Care | Source: Ambulatory Visit | Attending: Internal Medicine | Admitting: Internal Medicine

## 2023-09-07 DIAGNOSIS — Z0389 Encounter for observation for other suspected diseases and conditions ruled out: Secondary | ICD-10-CM | POA: Diagnosis not present

## 2023-09-07 DIAGNOSIS — E041 Nontoxic single thyroid nodule: Secondary | ICD-10-CM | POA: Diagnosis not present

## 2023-09-08 NOTE — Progress Notes (Signed)
Reviewed negative

## 2023-09-10 ENCOUNTER — Ambulatory Visit (HOSPITAL_COMMUNITY): Admission: RE | Admit: 2023-09-10 | Source: Ambulatory Visit

## 2023-10-23 ENCOUNTER — Other Ambulatory Visit: Payer: Self-pay

## 2023-10-23 DIAGNOSIS — N760 Acute vaginitis: Secondary | ICD-10-CM | POA: Diagnosis not present

## 2023-10-23 DIAGNOSIS — Z01419 Encounter for gynecological examination (general) (routine) without abnormal findings: Secondary | ICD-10-CM | POA: Diagnosis not present

## 2023-10-23 DIAGNOSIS — Z3040 Encounter for surveillance of contraceptives, unspecified: Secondary | ICD-10-CM | POA: Diagnosis not present

## 2023-10-23 DIAGNOSIS — Z113 Encounter for screening for infections with a predominantly sexual mode of transmission: Secondary | ICD-10-CM | POA: Diagnosis not present

## 2023-10-23 DIAGNOSIS — Z6822 Body mass index (BMI) 22.0-22.9, adult: Secondary | ICD-10-CM | POA: Diagnosis not present

## 2023-10-23 MED ORDER — ELINEST 0.3-30 MG-MCG PO TABS
1.0000 | ORAL_TABLET | Freq: Every day | ORAL | 3 refills | Status: AC
  Filled 2023-11-23: qty 84, 84d supply, fill #0
  Filled 2024-02-14: qty 84, 84d supply, fill #1

## 2023-11-23 ENCOUNTER — Other Ambulatory Visit: Payer: Self-pay

## 2023-11-24 ENCOUNTER — Other Ambulatory Visit: Payer: Self-pay

## 2024-02-11 ENCOUNTER — Ambulatory Visit: Payer: 59 | Admitting: Dermatology

## 2024-02-11 ENCOUNTER — Encounter: Payer: Self-pay | Admitting: Dermatology

## 2024-02-11 VITALS — BP 145/96

## 2024-02-11 DIAGNOSIS — L720 Epidermal cyst: Secondary | ICD-10-CM | POA: Diagnosis not present

## 2024-02-11 DIAGNOSIS — D1801 Hemangioma of skin and subcutaneous tissue: Secondary | ICD-10-CM

## 2024-02-11 DIAGNOSIS — L821 Other seborrheic keratosis: Secondary | ICD-10-CM

## 2024-02-11 DIAGNOSIS — Z1283 Encounter for screening for malignant neoplasm of skin: Secondary | ICD-10-CM | POA: Diagnosis not present

## 2024-02-11 DIAGNOSIS — L578 Other skin changes due to chronic exposure to nonionizing radiation: Secondary | ICD-10-CM | POA: Diagnosis not present

## 2024-02-11 DIAGNOSIS — D485 Neoplasm of uncertain behavior of skin: Secondary | ICD-10-CM

## 2024-02-11 DIAGNOSIS — D225 Melanocytic nevi of trunk: Secondary | ICD-10-CM | POA: Diagnosis not present

## 2024-02-11 DIAGNOSIS — W908XXA Exposure to other nonionizing radiation, initial encounter: Secondary | ICD-10-CM

## 2024-02-11 DIAGNOSIS — L814 Other melanin hyperpigmentation: Secondary | ICD-10-CM | POA: Diagnosis not present

## 2024-02-11 DIAGNOSIS — D239 Other benign neoplasm of skin, unspecified: Secondary | ICD-10-CM | POA: Insufficient documentation

## 2024-02-11 DIAGNOSIS — D229 Melanocytic nevi, unspecified: Secondary | ICD-10-CM

## 2024-02-11 NOTE — Patient Instructions (Addendum)

## 2024-02-11 NOTE — Progress Notes (Addendum)
 "  Total Body Skin Exam (TBSE) Visit   Subjective  Alexis Norton is a 24 y.o. female ESTABLISHED PATIENT who presents for the following:  Total Body Skin Exam (TBSE)  Patient was last evaluated for TBSE on 02/10/23 .  Patient does have spots of concern to be evaluated - cyst groin area. She does apply sunscreen and/or wears protective coverings.   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  Family Hx of Melanoma - mother.   The patient has spots, moles and lesions to be evaluated, some may be new or changing and the patient has concerns that these could be cancer.  The following portions of the chart were reviewed this encounter and updated as appropriate: medications, allergies, medical history  Review of Systems:  No other skin or systemic complaints except as noted in HPI or Assessment and Plan.  Objective  Well appearing patient in no apparent distress; mood and affect are within normal limits.  A full examination was performed including scalp, head, eyes, ears, nose, lips, neck, chest, axillae, abdomen, back, buttocks, bilateral upper extremities, bilateral lower extremities, hands, feet, fingers, toes, fingernails, and toenails. All findings within normal limits unless otherwise noted below.   Relevant physical exam findings are noted in the Assessment and Plan.         Left Lower Back 6.36mm irregular brown macule  Assessment & Plan   REPIGMENT OF Previously bx'd MODERATE DN - R gluteal crease  Moderately dysplastic nevus of the back A 6.5 mm irregular viral macule on the back, moderately dysplastic with pigment return, indicating potential for progression to melanoma, especially given family history of melanoma. - Scheduled excision with Dr. Paci for controlled removal and better cosmetic outcome.   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 - Well healed scar - No evidence of recurrence  today - Recommend regular full body skin exams - Recommend daily broad spectrum sunscreen SPF 30+ to sun-exposed areas, reapply every 2 hours as needed.  - Call if any new or changing lesions are noted between office visits  LENTIGINES, SEBORRHEIC KERATOSES, HEMANGIOMAS - Benign normal skin lesions - Benign-appearing - Call for any changes  BENIGN MELANOCYTIC NEVI - Tan-brown and/or pink-flesh-colored symmetric macules and papules - Benign appearing on exam today - Observation - Call clinic for new or changing moles - Recommend daily use of broad spectrum spf 30+ sunscreen to sun-exposed areas.   MILD ACTINIC DAMAGE - Chronic condition, secondary to cumulative UV/sun exposure - diffuse scaly erythematous macules with underlying dyspigmentation - Recommend daily broad spectrum sunscreen SPF 30+ to sun-exposed areas, reapply every 2 hours as needed.  - Staying in the shade or wearing long sleeves, sun glasses (UVA+UVB protection) and wide brim hats (4-inch brim around the entire circumference of the hat) are also recommended for sun protection.  - Call for new or changing lesions.  EPIDERMAL INCLUSION CYST Exam: Subcutaneous nodule at L side bikini line   Benign-appearing. Exam most consistent with an epidermal inclusion cyst. Discussed that a cyst is a benign growth that can grow over time and sometimes get irritated or inflamed. Recommend observation if it is not bothersome. Discussed option of surgical excision to remove it if it is growing, symptomatic, or other changes noted. Please call for new or changing lesions so they can be evaluated.  SKIN CANCER SCREENING PERFORMED TODAY.   DYSPLASTIC NEVUS   NEOPLASM OF  UNCERTAIN BEHAVIOR OF SKIN Left Lower Back - Skin / nail biopsy Type of biopsy: tangential   Informed consent: discussed and consent obtained   Timeout: patient name, date of birth, surgical site, and procedure verified   Procedure prep:  Patient was prepped and  draped in usual sterile fashion Prep type:  Isopropyl alcohol Anesthesia: the lesion was anesthetized in a standard fashion   Anesthetic:  1% lidocaine  w/ epinephrine 1-100,000 buffered w/ 8.4% NaHCO3 Instrument used: DermaBlade   Hemostasis achieved with: aluminum chloride   Outcome: patient tolerated procedure well   Post-procedure details: sterile dressing applied and wound care instructions given   Dressing type: petrolatum gauze and bandage    Specimen 1 - Surgical pathology Differential Diagnosis: r/o DN  Check Margins: yes SKIN EXAM FOR MALIGNANT NEOPLASM   MULTIPLE BENIGN MELANOCYTIC NEVI   CHERRY ANGIOMA   LENTIGINES   SEBORRHEIC KERATOSIS   ACTINIC SKIN DAMAGE   Return in about 1 year (around 02/10/2025) for TBSE.   Documentation: I have reviewed the above documentation for accuracy and completeness, and I agree with the above.  I, Shirron Maranda, CMA II, am acting as scribe for:  Delon Lenis, DO "

## 2024-02-15 LAB — SURGICAL PATHOLOGY

## 2024-02-16 ENCOUNTER — Ambulatory Visit: Payer: Self-pay | Admitting: Dermatology

## 2024-02-16 ENCOUNTER — Other Ambulatory Visit: Payer: Self-pay

## 2024-02-16 NOTE — Progress Notes (Signed)
 HI Shirron,  Please call pt and notify that their bx results showed an abnormal mole that requires a full excision in office with Dr Corey        1. Skin, left lower back :       ATYPICAL SPITZ TUMOR, MARGIN CLOSE, SEE DESCRIPTION

## 2024-04-21 ENCOUNTER — Encounter: Admitting: Dermatology

## 2024-05-05 ENCOUNTER — Encounter: Admitting: Dermatology

## 2024-09-07 ENCOUNTER — Encounter: Admitting: Internal Medicine
